# Patient Record
Sex: Male | Born: 1970 | Race: White | Hispanic: No | Marital: Married | Smoking: Never smoker
Health system: Southern US, Community
[De-identification: ages and names within clinical notes are randomized; demographics above are authoritative.]

## PROBLEM LIST (undated history)

## (undated) DIAGNOSIS — Z91018 Allergy to other foods: Secondary | ICD-10-CM

## (undated) DIAGNOSIS — E781 Pure hyperglyceridemia: Secondary | ICD-10-CM

## (undated) DIAGNOSIS — B019 Varicella without complication: Secondary | ICD-10-CM

## (undated) DIAGNOSIS — R51 Headache: Secondary | ICD-10-CM

## (undated) DIAGNOSIS — I10 Essential (primary) hypertension: Secondary | ICD-10-CM

## (undated) DIAGNOSIS — R519 Headache, unspecified: Secondary | ICD-10-CM

## (undated) DIAGNOSIS — K219 Gastro-esophageal reflux disease without esophagitis: Secondary | ICD-10-CM

## (undated) DIAGNOSIS — E785 Hyperlipidemia, unspecified: Secondary | ICD-10-CM

## (undated) DIAGNOSIS — I2510 Atherosclerotic heart disease of native coronary artery without angina pectoris: Secondary | ICD-10-CM

## (undated) DIAGNOSIS — I77810 Thoracic aortic ectasia: Secondary | ICD-10-CM

## (undated) DIAGNOSIS — G510 Bell's palsy: Secondary | ICD-10-CM

## (undated) DIAGNOSIS — G4733 Obstructive sleep apnea (adult) (pediatric): Secondary | ICD-10-CM

## (undated) HISTORY — DX: Bell's palsy: G51.0

## (undated) HISTORY — DX: Pure hyperglyceridemia: E78.1

## (undated) HISTORY — DX: Allergy to other foods: Z91.018

## (undated) HISTORY — DX: Gastro-esophageal reflux disease without esophagitis: K21.9

## (undated) HISTORY — DX: Thoracic aortic ectasia: I77.810

## (undated) HISTORY — DX: Varicella without complication: B01.9

## (undated) HISTORY — PX: KNEE ARTHROSCOPY: SUR90

## (undated) HISTORY — PX: EYE SURGERY: SHX253

## (undated) HISTORY — DX: Headache: R51

## (undated) HISTORY — DX: Atherosclerotic heart disease of native coronary artery without angina pectoris: I25.10

## (undated) HISTORY — DX: Headache, unspecified: R51.9

## (undated) HISTORY — DX: Essential (primary) hypertension: I10

## (undated) HISTORY — DX: Hyperlipidemia, unspecified: E78.5

## (undated) HISTORY — DX: Obstructive sleep apnea (adult) (pediatric): G47.33

---
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
## 2003-08-25 ENCOUNTER — Emergency Department (HOSPITAL_COMMUNITY): Admission: AD | Admit: 2003-08-25 | Discharge: 2003-08-25 | Payer: Self-pay | Admitting: Family Medicine

## 2003-08-30 ENCOUNTER — Encounter: Admission: RE | Admit: 2003-08-30 | Discharge: 2003-11-28 | Payer: Self-pay | Admitting: Family Medicine

## 2010-06-01 ENCOUNTER — Emergency Department (HOSPITAL_BASED_OUTPATIENT_CLINIC_OR_DEPARTMENT_OTHER)
Admission: EM | Admit: 2010-06-01 | Discharge: 2010-06-02 | Disposition: A | Discharge status: Other Acute Inpatient Hospital | Payer: Self-pay | Source: Home / Self Care | Admitting: Emergency Medicine

## 2010-06-02 ENCOUNTER — Observation Stay (HOSPITAL_COMMUNITY)
Admission: EM | Admit: 2010-06-02 | Discharge: 2010-06-02 | Payer: Self-pay | Attending: Internal Medicine | Admitting: Internal Medicine

## 2010-08-18 LAB — CBC
HCT: 41.9 % (ref 39.0–52.0)
HCT: 42.3 % (ref 39.0–52.0)
Hemoglobin: 13.9 g/dL (ref 13.0–17.0)
MCH: 27.5 pg (ref 26.0–34.0)
MCH: 28.1 pg (ref 26.0–34.0)
MCHC: 33.2 g/dL (ref 30.0–36.0)
MCHC: 34 g/dL (ref 30.0–36.0)
MCV: 80.9 fL (ref 78.0–100.0)
RBC: 4.95 MIL/uL (ref 4.22–5.81)
RDW: 12.9 % (ref 11.5–15.5)

## 2010-08-18 LAB — PROTIME-INR
INR: 0.93 (ref 0.00–1.49)
Prothrombin Time: 12.7 seconds (ref 11.6–15.2)

## 2010-08-18 LAB — COMPREHENSIVE METABOLIC PANEL
ALT: 31 U/L (ref 0–53)
AST: 33 U/L (ref 0–37)
CO2: 27 mEq/L (ref 19–32)
Calcium: 8.9 mg/dL (ref 8.4–10.5)
Chloride: 107 mEq/L (ref 96–112)
Creatinine, Ser: 0.99 mg/dL (ref 0.4–1.5)
GFR calc non Af Amer: >60 mL/min (ref >60)
Glucose, Bld: 103 mg/dL — ABNORMAL HIGH (ref 70–99)
Sodium: 140 mEq/L (ref 135–145)
Total Bilirubin: 0.6 mg/dL (ref 0.3–1.2)

## 2010-08-18 LAB — LIPID PANEL
Cholesterol: 268 mg/dL — ABNORMAL HIGH (ref 0–200)
LDL Cholesterol: 160 mg/dL — ABNORMAL HIGH (ref 0–99)
Triglycerides: 313 mg/dL — ABNORMAL HIGH (ref <150)
VLDL: 63 mg/dL — ABNORMAL HIGH (ref 0–40)

## 2010-08-18 LAB — DIFFERENTIAL
Basophils Absolute: 0 10*3/uL (ref 0.0–0.1)
Basophils Relative: 0 % (ref 0–1)
Eosinophils Relative: 5 % (ref 0–5)
Monocytes Absolute: 1 10*3/uL (ref 0.1–1.0)
Monocytes Relative: 14 % — ABNORMAL HIGH (ref 3–12)

## 2010-08-18 LAB — BASIC METABOLIC PANEL
BUN: 17 mg/dL (ref 6–23)
CO2: 24 mEq/L (ref 19–32)
Chloride: 107 mEq/L (ref 96–112)
Glucose, Bld: 100 mg/dL — ABNORMAL HIGH (ref 70–99)
Potassium: 4.1 mEq/L (ref 3.5–5.1)

## 2010-08-18 LAB — CARDIAC PANEL(CRET KIN+CKTOT+MB+TROPI)
Relative Index: 2.9 — ABNORMAL HIGH (ref 0.0–2.5)
Troponin I: 0.01 ng/mL (ref 0.00–0.06)

## 2010-09-09 NOTE — Discharge Summary (Signed)
  NAME:  William Barton, William Barton NO.:  [id number]  MEDICAL RECORD NO.:  [id number]          PATIENT TYPE:  INP  LOCATION:  3702                         FACILITY:  [facility]  PHYSICIAN:  Calvert Cantor, M.D.     DATE OF BIRTH:  Sep 25, 1970  DATE OF ADMISSION:  06/02/2010 DATE OF DISCHARGE:  06/02/2010                              DISCHARGE SUMMARY   PRIMARY CARE PHYSICIAN:  [facility].  PRESENTING COMPLAINT:  Left-sided facial weakness.  DISCHARGE DIAGNOSIS:  Left-sided facial weakness, most likely Bell palsy.  DISCHARGE MEDICATIONS:  The patient has been placed on a prednisone taper.  HOSPITAL COURSE:  This is a 40 year old male who presented to the hospital with a complaint of left-sided weakness which he woke up with on the morning prior to admission, he had difficulty closing his left eye.  The patient was admitted and had an MRI of the brain which did rule out an acute CVA.  It is suspected that this is Bell palsy and should resolve in the next few weeks.  Speech and swallow eval was requested and he was able to pass this.  He does not require any further therapy.  He is advised to follow up with his primary care physician in 1-2 weeks. Time on discharge was 35 minutes.     Calvert Cantor, M.D.     SR/MEDQ  D:  07/17/2010  T:  07/18/2010  Job:  [id number]  Electronically Signed by Calvert Cantor M.D. on 07/20/2010 03:03:18 PM

## 2013-10-13 DIAGNOSIS — J302 Other seasonal allergic rhinitis: Secondary | ICD-10-CM | POA: Insufficient documentation

## 2016-01-10 DIAGNOSIS — J343 Hypertrophy of nasal turbinates: Secondary | ICD-10-CM | POA: Insufficient documentation

## 2016-01-10 DIAGNOSIS — J342 Deviated nasal septum: Secondary | ICD-10-CM | POA: Insufficient documentation

## 2016-05-07 HISTORY — PX: NASAL SEPTUM SURGERY: SHX37

## 2016-07-24 ENCOUNTER — Telehealth: Payer: Self-pay | Admitting: Cardiology

## 2016-07-24 ENCOUNTER — Encounter: Payer: Self-pay | Admitting: Cardiology

## 2016-07-24 NOTE — Telephone Encounter (Signed)
Called pt and left message for pt to call back to update his Fm and Medical information.

## 2016-07-24 NOTE — Telephone Encounter (Signed)
Patient is returning your call,thanks. °

## 2016-07-24 NOTE — Telephone Encounter (Signed)
Matter has been addressed

## 2016-08-03 ENCOUNTER — Encounter: Payer: Self-pay | Admitting: Cardiology

## 2016-08-03 ENCOUNTER — Ambulatory Visit (INDEPENDENT_AMBULATORY_CARE_PROVIDER_SITE_OTHER): Payer: 59 | Admitting: Cardiology

## 2016-08-03 ENCOUNTER — Encounter (INDEPENDENT_AMBULATORY_CARE_PROVIDER_SITE_OTHER): Payer: Self-pay

## 2016-08-03 VITALS — BP 124/96 | HR 75 | Ht 69.0 in | Wt 210.0 lb

## 2016-08-03 DIAGNOSIS — E7800 Pure hypercholesterolemia, unspecified: Secondary | ICD-10-CM

## 2016-08-03 DIAGNOSIS — Z8249 Family history of ischemic heart disease and other diseases of the circulatory system: Secondary | ICD-10-CM | POA: Diagnosis not present

## 2016-08-03 DIAGNOSIS — E785 Hyperlipidemia, unspecified: Secondary | ICD-10-CM | POA: Insufficient documentation

## 2016-08-03 DIAGNOSIS — I10 Essential (primary) hypertension: Secondary | ICD-10-CM | POA: Insufficient documentation

## 2016-08-03 NOTE — Addendum Note (Signed)
Addended by: Chana BodeGREEN, Sie Formisano L on: 08/03/2016 01:57 PM   Modules accepted: Orders

## 2016-08-03 NOTE — Progress Notes (Signed)
Cardiology Office Note    Date:  08/03/2016   ID:  William Barton, DOB 01-Feb-1971, MRN [id number]  PCP:  Birdena JubileeZANARD, ROBYN, MD  Cardiologist:  Armanda Magicraci Asjah Rauda, MD   Chief Complaint  Patient presents with  . New Evaluation    family history for CAD/HTN and hyperlipidemia    History of Present Illness:  William Barton is a 46 y.o. male with a history of HTN and hyperlipidemia who requested to be seen by a Cardiologist due to family history of CAD.  He denies any chest pain or pressure, SOB, DOE, LE edema, dizziness, palpitations  or syncope.  He has no claudication symptoms.  His mom was diagnosed with MI and CAD at age 47.  He has never smoked. He does not get any aerobic exercise although has recently tried to start running.      Past Medical History:  Diagnosis Date  . Bell's palsy   . Chicken pox   . Generalized headaches   . GERD (gastroesophageal reflux disease)   . Hyperlipidemia   . Hypertension   . Hypertriglyceridemia     Past Surgical History:  Procedure Laterality Date  . APPENDECTOMY  1981  . EYE SURGERY     1975  . KNEE ARTHROSCOPY Left    2016  . NASAL SEPTUM SURGERY  05/07/2016  . TONSILLECTOMY  1975    Current Medications: No outpatient prescriptions have been marked as taking for the 08/03/16 encounter (Office Visit) with Quintella Reichertraci R Ashaad Gaertner, MD.    Allergies:   Ace inhibitors   Social History   Social History  . Marital status: Married    Spouse name: TRACEY  . Number of children: 2  . Years of education: 12   Occupational History  . [employer]    Social History Main Topics  . Smoking status: Never Smoker  . Smokeless tobacco: Never Used  . Alcohol use Yes  . Drug use: No  . Sexual activity: Yes    Partners: Female   Other Topics Concern  . None   Social History Narrative  . None     Family History:  The patient's family history includes Breast cancer in his maternal grandmother; Heart disease in his mother; Hypertension in his  maternal grandmother; Prostate cancer in his paternal grandfather.   ROS:   Please see the history of present illness.    ROS All other systems reviewed and are negative.  No flowsheet data found.     PHYSICAL EXAM:   VS:  BP (!) 124/96   Pulse 75   Ht 5\' 9"  (1.753 m)   Wt 210 lb (95.3 kg)   BMI 31.01 kg/m    GEN: Well nourished, well developed, in no acute distress  HEENT: normal  Neck: no JVD, carotid bruits, or masses Cardiac: RRR; no murmurs, rubs, or gallops,no edema.  Intact distal pulses bilaterally.  Respiratory:  clear to auscultation bilaterally, normal work of breathing GI: soft, nontender, nondistended, + BS MS: no deformity or atrophy  Skin: warm and dry, no rash Neuro:  Alert and Oriented x 3, Strength and sensation are intact Psych: euthymic mood, full affect  Wt Readings from Last 3 Encounters:  08/03/16 210 lb (95.3 kg)      Studies/Labs Reviewed:   EKG:  EKG is ordered today.  The ekg ordered today demonstrates NSR with no ST changes  Recent Labs: No results found for requested labs within last 8760 hours.   Lipid Panel  Component Value Date/Time   CHOL (H) 06/02/2010 0840    268        ATP III CLASSIFICATION:  <200     mg/dL   Desirable  161-096  mg/dL   Borderline High  >=045    mg/dL   High          TRIG 409 (H) 06/02/2010 0840   HDL 45 06/02/2010 0840   CHOLHDL 6.0 06/02/2010 0840   VLDL 63 (H) 06/02/2010 0840   LDLCALC (H) 06/02/2010 0840    160        Total Cholesterol/HDL:CHD Risk Coronary Heart Disease Risk Table                     Men   Women  1/2 Average Risk   3.4   3.3  Average Risk       5.0   4.4  2 X Average Risk   9.6   7.1  3 X Average Risk  23.4   11.0        Use the calculated Patient Ratio above and the CHD Risk Table to determine the patient's CHD Risk.        ATP III CLASSIFICATION (LDL):  <100     mg/dL   Optimal  811-914  mg/dL   Near or Above                    Optimal  130-159  mg/dL   Borderline   782-956  mg/dL   High  >213     mg/dL   Very High    Additional studies/ records that were reviewed today include:  Office notes from PCP    ASSESSMENT:    1. Family history of early CAD   2. Benign essential HTN   3. Pure hypercholesterolemia      PLAN:  In order of problems listed above:  1. Family history of CAD - he is asymptomatic.  His CRFs include HTN, hyperlipidemia and family history of CAD.  His EKG is nonischemic. I will get an ETT to rule out ischemia and Chest CT for calcium score for risk stratification.   2. HTN - mainly diastolic HTN.  His DBP is elevated at .  I have asked him to check his BP daily for a week and call with results.  If still elevated, I will increase his amlodipine. 3. Hyperlipidemia - He will continue on statin and fenofibrate.     Medication Adjustments/Labs and Tests Ordered: Current medicines are reviewed at length with the patient today.  Concerns regarding medicines are outlined above.  Medication changes, Labs and Tests ordered today are listed in the Patient Instructions below.  There are no Patient Instructions on file for this visit.   Signed, Armanda Magic, MD  08/03/2016 11:48 AM    [facility] [address] [place], [place], [place]  [address] Phone: [phone]; Fax: [phone]

## 2016-08-03 NOTE — Patient Instructions (Signed)
Medication Instructions:  Your physician recommends that you continue on your current medications as directed. Please refer to the Current Medication list given to you today.   Labwork: None    Testing/Procedures: Your physician has requested that you have an exercise tolerance test. For further information please visit [email or web address]. Please also follow instruction sheet, as given.   Dr. Mayford Knifeurner recommends you have a CORONARY CALCIUM SCORE.  Follow-Up: Your physician wants you to follow-up in: 1 year with Dr. Mayford Knifeurner.  You will receive a reminder letter in the mail two months in advance. If you don't receive a letter, please call our office to schedule the follow-up appointment.   Any Other Special Instructions Will Be Listed Below (If Applicable).     If you need a refill on your cardiac medications before your next appointment, please call your pharmacy.

## 2016-08-12 ENCOUNTER — Ambulatory Visit (INDEPENDENT_AMBULATORY_CARE_PROVIDER_SITE_OTHER): Payer: 59

## 2016-08-12 ENCOUNTER — Ambulatory Visit (INDEPENDENT_AMBULATORY_CARE_PROVIDER_SITE_OTHER)
Admission: RE | Admit: 2016-08-12 | Discharge: 2016-08-12 | Disposition: A | Discharge status: Home / Self Care | Payer: Self-pay | Source: Ambulatory Visit | Attending: Cardiology | Admitting: Cardiology

## 2016-08-12 DIAGNOSIS — I10 Essential (primary) hypertension: Secondary | ICD-10-CM | POA: Diagnosis not present

## 2016-08-12 DIAGNOSIS — E7800 Pure hypercholesterolemia, unspecified: Secondary | ICD-10-CM

## 2016-08-12 DIAGNOSIS — Z8249 Family history of ischemic heart disease and other diseases of the circulatory system: Secondary | ICD-10-CM | POA: Diagnosis not present

## 2016-08-12 LAB — EXERCISE TOLERANCE TEST
CHL CUP RESTING HR STRESS: 81 {beats}/min
CHL CUP STRESS STAGE 1 GRADE: 0 %
CHL CUP STRESS STAGE 1 HR: 82 {beats}/min
CHL CUP STRESS STAGE 1 SBP: 146 mmHg
CHL CUP STRESS STAGE 1 SPEED: 0 mph
CHL CUP STRESS STAGE 10 DBP: 96 mmHg
CHL CUP STRESS STAGE 10 GRADE: 0 %
CHL CUP STRESS STAGE 2 GRADE: 0 %
CHL CUP STRESS STAGE 2 HR: 86 {beats}/min
CHL CUP STRESS STAGE 2 SPEED: 0.9 mph
CHL CUP STRESS STAGE 4 GRADE: 10 %
CHL CUP STRESS STAGE 5 GRADE: 12 %
CHL CUP STRESS STAGE 5 HR: 110 {beats}/min
CHL CUP STRESS STAGE 5 SBP: 182 mmHg
CHL CUP STRESS STAGE 6 SBP: 201 mmHg
CHL CUP STRESS STAGE 7 SBP: 210 mmHg
CHL CUP STRESS STAGE 9 GRADE: 0 %
CSEPED: 13 min
CSEPEDS: 0 s
CSEPEW: 15.3 METS
CSEPHR: 95 %
MPHR: 174 {beats}/min
Peak HR: 164 {beats}/min
Percent of predicted max HR: 94 %
RPE: 15
Stage 1 DBP: 100 mmHg
Stage 10 HR: 100 {beats}/min
Stage 10 SBP: 155 mmHg
Stage 10 Speed: 0 mph
Stage 3 Grade: 0.1 %
Stage 3 HR: 86 {beats}/min
Stage 3 Speed: 1 mph
Stage 4 HR: 99 {beats}/min
Stage 4 Speed: 1.7 mph
Stage 5 DBP: 98 mmHg
Stage 5 Speed: 2.5 mph
Stage 6 DBP: 98 mmHg
Stage 6 Grade: 14 %
Stage 6 HR: 121 {beats}/min
Stage 6 Speed: 3.5 mph
Stage 7 DBP: 93 mmHg
Stage 7 Grade: 16 %
Stage 7 HR: 141 {beats}/min
Stage 7 Speed: 4.2 mph
Stage 8 Grade: 18 %
Stage 8 HR: 164 {beats}/min
Stage 8 Speed: 5 mph
Stage 9 DBP: 85 mmHg
Stage 9 HR: 142 {beats}/min
Stage 9 SBP: 175 mmHg
Stage 9 Speed: 1.5 mph

## 2016-08-19 ENCOUNTER — Telehealth: Payer: Self-pay | Admitting: Cardiology

## 2016-08-19 NOTE — Telephone Encounter (Signed)
Informed patient labs have not been received. Informed him his PCP will be called tomorrow to request. He understands he will be called once received and reviewed by Dr. Mayford Knifeurner. He was grateful for call.

## 2016-08-19 NOTE — Telephone Encounter (Signed)
New Message  Pt voiced calling to check on fax.  Please f/u

## 2016-08-20 NOTE — Telephone Encounter (Signed)
Labs received and given to Medical Records to be routed to Dr. Turner. 

## 2016-08-26 ENCOUNTER — Telehealth: Payer: Self-pay

## 2016-08-26 DIAGNOSIS — E785 Hyperlipidemia, unspecified: Secondary | ICD-10-CM

## 2016-08-26 NOTE — Telephone Encounter (Signed)
Dr. Mayford Knifeurner reviewed labs in Care Everywhere.  Per Dr. Mayford Knifeurner, LDL 76 and too high as his goal is to be below 70. Patient to START ZETIA 10 mg daily and scheduled FLP and ALT in 8 weeks.

## 2016-08-26 NOTE — Telephone Encounter (Signed)
Patient declines to start new medication at this time. He will watch his fat intake and exercise more and repeat FLP and ALT in 4 months. Repeat fasting labs scheduled 8/1. Patient agrees with treatment plan.

## 2016-11-23 ENCOUNTER — Telehealth: Payer: Self-pay | Admitting: Cardiology

## 2016-11-23 NOTE — Telephone Encounter (Signed)
New message    Albin FellingCarla from [facility] is calling to find out if EKG was received and reviewed for pt. She said they faxed it on Monday, but she will refax it today.

## 2016-11-24 NOTE — Telephone Encounter (Signed)
Instructed Carla to re-fax EKG to [phone]. She agrees with treatment plan.

## 2016-11-25 ENCOUNTER — Telehealth: Payer: Self-pay | Admitting: Cardiology

## 2016-11-25 NOTE — Telephone Encounter (Signed)
EKG received and given to Dr. Mayford Knifeurner.  Instructed Dr. Mayford Knifeurner to and call Dr. Alberteen SamZanard to discuss.

## 2016-11-25 NOTE — Telephone Encounter (Signed)
Called by Dr. Vicenta DunningZanad in regards to patient.  He lost a relative from an MI recently and now complaining of atypical CP which she thinks is due to GERD as he stopped taking his PPI.  She restarted this but wanted me to review his EKG.  The EKG showed NSR with subtle 0.535mm of J point elevation more than prior EKG in V3 only and otherwise unchanged from prior EKG.  I recommended that he followup with us in office tomorrow as he did have CT showing coronary calcium in his LAD and RCA.  She will call patient and let us know whether he is willing to followup tomorrow with us.

## 2017-01-06 ENCOUNTER — Other Ambulatory Visit: Payer: 59 | Admitting: *Deleted

## 2017-01-06 DIAGNOSIS — E785 Hyperlipidemia, unspecified: Secondary | ICD-10-CM

## 2017-01-06 LAB — LIPID PANEL
CHOLESTEROL TOTAL: 145 mg/dL (ref 100–199)
Chol/HDL Ratio: 3.6 ratio (ref 0.0–5.0)
HDL: 40 mg/dL (ref >39)
LDL CALC: 81 mg/dL (ref 0–99)
TRIGLYCERIDES: 121 mg/dL (ref 0–149)
VLDL CHOLESTEROL CAL: 24 mg/dL (ref 5–40)

## 2017-01-06 LAB — ALT: ALT: 29 IU/L (ref 0–44)

## 2017-06-22 DIAGNOSIS — K5730 Diverticulosis of large intestine without perforation or abscess without bleeding: Secondary | ICD-10-CM | POA: Insufficient documentation

## 2017-06-22 DIAGNOSIS — K219 Gastro-esophageal reflux disease without esophagitis: Secondary | ICD-10-CM | POA: Insufficient documentation

## 2017-06-26 ENCOUNTER — Emergency Department (HOSPITAL_BASED_OUTPATIENT_CLINIC_OR_DEPARTMENT_OTHER)
Admission: EM | Admit: 2017-06-26 | Discharge: 2017-06-26 | Disposition: A | Discharge status: Home / Self Care | Payer: 59 | Attending: Emergency Medicine | Admitting: Emergency Medicine

## 2017-06-26 ENCOUNTER — Other Ambulatory Visit: Payer: Self-pay

## 2017-06-26 ENCOUNTER — Emergency Department (HOSPITAL_BASED_OUTPATIENT_CLINIC_OR_DEPARTMENT_OTHER): Payer: 59

## 2017-06-26 ENCOUNTER — Encounter (HOSPITAL_BASED_OUTPATIENT_CLINIC_OR_DEPARTMENT_OTHER): Payer: Self-pay | Admitting: *Deleted

## 2017-06-26 DIAGNOSIS — R51 Headache: Secondary | ICD-10-CM | POA: Insufficient documentation

## 2017-06-26 DIAGNOSIS — Z79899 Other long term (current) drug therapy: Secondary | ICD-10-CM | POA: Insufficient documentation

## 2017-06-26 DIAGNOSIS — I10 Essential (primary) hypertension: Secondary | ICD-10-CM | POA: Insufficient documentation

## 2017-06-26 DIAGNOSIS — R519 Headache, unspecified: Secondary | ICD-10-CM

## 2017-06-26 LAB — BASIC METABOLIC PANEL
ANION GAP: 7 (ref 5–15)
BUN: 17 mg/dL (ref 6–20)
CALCIUM: 9.3 mg/dL (ref 8.9–10.3)
CO2: 24 mmol/L (ref 22–32)
CREATININE: 0.99 mg/dL (ref 0.61–1.24)
Chloride: 109 mmol/L (ref 101–111)
GLUCOSE: 100 mg/dL — AB (ref 65–99)
Potassium: 3.8 mmol/L (ref 3.5–5.1)
Sodium: 140 mmol/L (ref 135–145)

## 2017-06-26 LAB — CBC WITH DIFFERENTIAL/PLATELET
BASOS ABS: 0 10*3/uL (ref 0.0–0.1)
BASOS PCT: 1 %
EOS ABS: 0.2 10*3/uL (ref 0.0–0.7)
EOS PCT: 4 %
HEMATOCRIT: 40.6 % (ref 39.0–52.0)
Hemoglobin: 13.7 g/dL (ref 13.0–17.0)
Lymphocytes Relative: 32 %
Lymphs Abs: 1.3 10*3/uL (ref 0.7–4.0)
MCH: 29.3 pg (ref 26.0–34.0)
MCHC: 33.7 g/dL (ref 30.0–36.0)
MCV: 86.8 fL (ref 78.0–100.0)
MONO ABS: 0.5 10*3/uL (ref 0.1–1.0)
Monocytes Relative: 12 %
NEUTROS ABS: 2.2 10*3/uL (ref 1.7–7.7)
Neutrophils Relative %: 53 %
Platelets: 309 10*3/uL (ref 150–400)
RBC: 4.68 MIL/uL (ref 4.22–5.81)
RDW: 12.9 % (ref 11.5–15.5)
WBC: 4.1 10*3/uL (ref 4.0–10.5)

## 2017-06-26 MED ORDER — SODIUM CHLORIDE 0.9 % IV BOLUS (SEPSIS)
1000.0000 mL | Freq: Once | INTRAVENOUS | Status: AC
  Administered 2017-06-26: 1000 mL via INTRAVENOUS

## 2017-06-26 MED ORDER — DIPHENHYDRAMINE HCL 50 MG/ML IJ SOLN: 25.0000 mg | Freq: Once | INTRAMUSCULAR | Status: DC

## 2017-06-26 MED ORDER — SODIUM CHLORIDE 0.9 % IV SOLN: INTRAVENOUS | Status: DC

## 2017-06-26 MED ORDER — DEXAMETHASONE SODIUM PHOSPHATE 10 MG/ML IJ SOLN
10.0000 mg | Freq: Once | INTRAMUSCULAR | Status: AC
  Administered 2017-06-26: 10 mg via INTRAVENOUS
  Filled 2017-06-26: qty 1

## 2017-06-26 MED ORDER — METOCLOPRAMIDE HCL 5 MG/ML IJ SOLN
10.0000 mg | Freq: Once | INTRAMUSCULAR | Status: AC
  Administered 2017-06-26: 10 mg via INTRAVENOUS
  Filled 2017-06-26: qty 2

## 2017-06-26 MED ORDER — DEXAMETHASONE SODIUM PHOSPHATE 10 MG/ML IJ SOLN
INTRAMUSCULAR | Status: AC
  Administered 2017-06-26: 10 mg via INTRAVENOUS
  Filled 2017-06-26: qty 1

## 2017-06-26 NOTE — ED Triage Notes (Signed)
Pt c/o h/a x 4 days 

## 2017-06-26 NOTE — Discharge Instructions (Signed)
Go home and rest.  Head CT without any acute findings.  Hopefully the Decadron will abate the headache by morning.  If not very important to follow-up with your primary care doctor or if there is a recurrence.  If headache persist she will need MRI of the brain.  Return for any new or worse symptoms.

## 2017-06-26 NOTE — ED Provider Notes (Signed)
[facility] EMERGENCY DEPARTMENT Provider Note   CSN: [id number] Arrival date & time: 06/26/17  4098     History   Chief Complaint Chief Complaint  Patient presents with  . Headache    HPI William Barton is a 47 y.o. male.  Patient with complaint of headache severe at times on the top of the head and bilateral parietal area.  Ongoing for 4 days.  No visual changes.  No weakness no numbness.  No neck pain no back pain no fevers.  No history of migraines.  Patient at times has had mild headaches in the past but usually easily resolved with a BC powder.      Past Medical History:  Diagnosis Date  . Bell's palsy   . Chicken pox   . Generalized headaches   . GERD (gastroesophageal reflux disease)   . Hyperlipidemia   . Hypertension   . Hypertriglyceridemia     Patient Active Problem List   Diagnosis Date Noted  . Family history of early CAD 08/03/2016  . Benign essential HTN 08/03/2016  . Hyperlipidemia 08/03/2016    Past Surgical History:  Procedure Laterality Date  . APPENDECTOMY  1981  . EYE SURGERY     1975  . KNEE ARTHROSCOPY Left    2016  . NASAL SEPTUM SURGERY  05/07/2016  . TONSILLECTOMY  1975       Home Medications    Prior to Admission medications   Medication Sig Start Date End Date Taking? Authorizing Provider  amLODipine (NORVASC) 5 MG tablet Take 5 mg by mouth daily.    [provider]  atorvastatin (LIPITOR) 80 MG tablet Pt takes 40 mg twice a week    [provider]  fenofibrate micronized (LOFIBRA) 134 MG capsule Take 134 mg by mouth daily before breakfast.    [provider]  ranitidine (ZANTAC) 150 MG tablet Take 150 mg by mouth at bedtime.    [provider]    Family History Family History  Problem Relation Age of Onset  . Heart disease Mother   . Breast cancer Maternal Grandmother   . Hypertension Maternal Grandmother   . Prostate cancer Paternal Grandfather     Social  History Social History   Tobacco Use  . Smoking status: Never Smoker  . Smokeless tobacco: Never Used  Substance Use Topics  . Alcohol use: Yes  . Drug use: No     Allergies   Ace inhibitors   Review of Systems Review of Systems  Constitutional: Negative for fever.  HENT: Negative for congestion.   Respiratory: Negative for shortness of breath.   Cardiovascular: Negative for chest pain.  Gastrointestinal: Negative for abdominal pain.  Musculoskeletal: Negative for back pain, neck pain and neck stiffness.  Skin: Negative for rash.  Neurological: Positive for headaches. Negative for dizziness, syncope, speech difficulty, weakness and numbness.  Hematological: Does not bruise/bleed easily.  Psychiatric/Behavioral: Negative for confusion.     Physical Exam Updated Vital Signs BP (!) 156/81 (BP Location: Left Arm)   Pulse 64   Temp 97.8 F (36.6 C) (Oral)   Resp 18   Ht 1.753 m (5\' 9" )   Wt 97.5 kg (215 lb)   SpO2 100%   BMI 31.75 kg/m   Physical Exam  Constitutional: He is oriented to person, place, and time. He appears well-developed and well-nourished. No distress.  HENT:  Head: Normocephalic and atraumatic.  Mouth/Throat: Oropharynx is clear and moist.  Eyes: Conjunctivae and EOM are normal. Pupils  are equal, round, and reactive to light.  Neck: Normal range of motion. Neck supple.  Cardiovascular: Normal rate, regular rhythm and normal heart sounds.  Pulmonary/Chest: Effort normal and breath sounds normal. No respiratory distress.  Abdominal: Soft. Bowel sounds are normal. There is no tenderness.  Musculoskeletal: Normal range of motion. He exhibits no edema.  Neurological: He is alert and oriented to person, place, and time. No cranial nerve deficit or sensory deficit. He exhibits normal muscle tone. Coordination normal.  Skin: Skin is warm.  Nursing note and vitals reviewed.    ED Treatments / Results  Labs (all labs ordered are listed, but only  abnormal results are displayed) Labs Reviewed  BASIC METABOLIC PANEL - Abnormal; Notable for the following components:      Result Value   Glucose, Bld 100 (*)    All other components within normal limits  CBC WITH DIFFERENTIAL/PLATELET    EKG  EKG Interpretation None       Radiology Ct Head Wo Contrast  Result Date: 06/26/2017 CLINICAL DATA:  Frontal headaches, hypertension, dizziness, blurred vision EXAM: CT HEAD WITHOUT CONTRAST TECHNIQUE: Contiguous axial images were obtained from the base of the skull through the vertex without intravenous contrast. COMPARISON:  02/06/2015 FINDINGS: Brain: No evidence of acute infarction, hemorrhage, hydrocephalus, extra-axial collection or mass lesion/mass effect. Vascular: No hyperdense vessel or unexpected calcification. Skull: Normal. Negative for fracture or focal lesion. Sinuses/Orbits: No acute finding. Other: None. IMPRESSION: Normal head CT without contrast Electronically Signed   By: Judie PetitM.  Shick M.D.   On: 06/26/2017 11:32    Procedures Procedures (including critical care time)  Medications Ordered in ED Medications  0.9 %  sodium chloride infusion ( Intravenous Not Given 06/26/17 1048)  diphenhydrAMINE (BENADRYL) injection 25 mg (25 mg Intravenous Not Given 06/26/17 1045)  sodium chloride 0.9 % bolus 1,000 mL (0 mLs Intravenous Stopped 06/26/17 1154)  dexamethasone (DECADRON) injection 10 mg (10 mg Intravenous Given 06/26/17 1101)  metoCLOPramide (REGLAN) injection 10 mg (10 mg Intravenous Given 06/26/17 1102)     Initial Impression / Assessment and Plan / ED Course  I have reviewed the triage vital signs and the nursing notes.  Pertinent labs & imaging results that were available during my care of the patient were reviewed by me and considered in my medical decision making (see chart for details).    CT head negative.  Basic labs negative.  Patient treated with migraine cocktail with improvement here.  Patient's blood pressure was  elevated.  Patient does have primary care provider appointment later this week.  Will have that rechecked.  Patient does have a history of hypertension.  Patient nontoxic no acute distress.  MRI was available today did offer MRI but patient stated that feeling better with rather follow-up with his doctor.  No focal neuro deficits.  With no concerns for meningitis.  Patient understands that if headaches persist MRI would be important part of the workup.   Final Clinical Impressions(s) / ED Diagnoses   Final diagnoses:  Acute intractable headache, unspecified headache type    ED Discharge Orders    None       Vanetta MuldersZackowski, Jama Mcmiller, MD 06/26/17 1546

## 2017-10-27 ENCOUNTER — Ambulatory Visit: Payer: 59 | Admitting: Cardiology

## 2017-11-10 DIAGNOSIS — R509 Fever, unspecified: Secondary | ICD-10-CM | POA: Insufficient documentation

## 2017-11-10 DIAGNOSIS — R059 Cough, unspecified: Secondary | ICD-10-CM | POA: Insufficient documentation

## 2017-11-10 DIAGNOSIS — W57XXXA Bitten or stung by nonvenomous insect and other nonvenomous arthropods, initial encounter: Secondary | ICD-10-CM | POA: Insufficient documentation

## 2017-11-10 DIAGNOSIS — J029 Acute pharyngitis, unspecified: Secondary | ICD-10-CM | POA: Insufficient documentation

## 2017-12-24 ENCOUNTER — Ambulatory Visit (INDEPENDENT_AMBULATORY_CARE_PROVIDER_SITE_OTHER): Payer: 59 | Admitting: Cardiology

## 2017-12-24 ENCOUNTER — Encounter: Payer: Self-pay | Admitting: Cardiology

## 2017-12-24 VITALS — BP 138/94 | HR 49 | Ht 69.0 in | Wt 214.0 lb

## 2017-12-24 DIAGNOSIS — I2510 Atherosclerotic heart disease of native coronary artery without angina pectoris: Secondary | ICD-10-CM | POA: Diagnosis not present

## 2017-12-24 DIAGNOSIS — G4733 Obstructive sleep apnea (adult) (pediatric): Secondary | ICD-10-CM | POA: Insufficient documentation

## 2017-12-24 DIAGNOSIS — I77810 Thoracic aortic ectasia: Secondary | ICD-10-CM | POA: Diagnosis not present

## 2017-12-24 DIAGNOSIS — Z8249 Family history of ischemic heart disease and other diseases of the circulatory system: Secondary | ICD-10-CM

## 2017-12-24 DIAGNOSIS — E7800 Pure hypercholesterolemia, unspecified: Secondary | ICD-10-CM

## 2017-12-24 DIAGNOSIS — I10 Essential (primary) hypertension: Secondary | ICD-10-CM

## 2017-12-24 HISTORY — DX: Atherosclerotic heart disease of native coronary artery without angina pectoris: I25.10

## 2017-12-24 HISTORY — DX: Thoracic aortic ectasia: I77.810

## 2017-12-24 LAB — LIPID PANEL
CHOL/HDL RATIO: 2.9 ratio (ref 0.0–5.0)
Cholesterol, Total: 139 mg/dL (ref 100–199)
HDL: 48 mg/dL (ref >39)
LDL Calculated: 78 mg/dL (ref 0–99)
Triglycerides: 64 mg/dL (ref 0–149)
VLDL Cholesterol Cal: 13 mg/dL (ref 5–40)

## 2017-12-24 LAB — HEPATIC FUNCTION PANEL
ALT: 24 IU/L (ref 0–44)
AST: 24 IU/L (ref 0–40)
Albumin: 4.5 g/dL (ref 3.5–5.5)
Alkaline Phosphatase: 61 IU/L (ref 39–117)
BILIRUBIN TOTAL: 0.4 mg/dL (ref 0.0–1.2)
BILIRUBIN, DIRECT: 0.12 mg/dL (ref 0.00–0.40)
Total Protein: 6.8 g/dL (ref 6.0–8.5)

## 2017-12-24 MED ORDER — AMLODIPINE BESYLATE 10 MG PO TABS: 10.0000 mg | ORAL_TABLET | Freq: Every day | ORAL | 3 refills | Status: DC

## 2017-12-24 NOTE — Progress Notes (Signed)
Cardiology Office Note:    Date:  12/24/2017   ID:  William DurhamWilliam Hazzard, DOB 02/07/1971, MRN [id number]  PCP:  Gillian ScarceZanard, Robyn K, MD  Cardiologist:  No primary care provider on file.    Referring MD: Gillian ScarceZanard, Robyn K, MD   Chief Complaint  Patient presents with  . Follow-up    Coronary artery calcifications, hyperlipidemia, hypertension    History of Present Illness:    William DurhamWilliam Swader is a 47 y.o. male with a hx of hypertension, hyperlipidemia and family history of CAD.  He saw me a year ago for cardiac evaluation due to a strong family history of coronary disease with his mom having an MI at 1467.  Exercise stress test was normal with no ischemia.  Coronary calcium score was 14 which was 82 percentile for age and sex matched controls.  He was also noted to have a dilated a sending aorta at 3.9 mm.  He was diagnosed with sleep apnea by his PCP and did not tolerate CPAP and is now using a oral device and just got finished with a home sleep study to see how well it is working.  He is here today for followup and is doing well.  He denies any chest pain or pressure, SOB, DOE, PND, orthopnea, LE edema, dizziness, palpitations or syncope. He is compliant with his meds and is tolerating meds with no SE.    Past Medical History:  Diagnosis Date  . 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 Bell's palsy   . Chicken pox   . Coronary artery calcification seen on CAT scan 12/24/2017  . Generalized headaches   . GERD (gastroesophageal reflux disease)   . Hyperlipidemia   . Hypertension   . Hypertriglyceridemia   . OSA (obstructive sleep apnea)    Intolerant to CPAP therapy and now using an oral device    Past Surgical History:  Procedure Laterality Date  . APPENDECTOMY  1981  . EYE SURGERY     1975  . KNEE ARTHROSCOPY Left    2016  . NASAL SEPTUM SURGERY  05/07/2016  . TONSILLECTOMY  1975    Current Medications: Current Meds  Medication Sig  . atorvastatin (LIPITOR) 80 MG tablet Pt  takes 40 mg twice a week  . fenofibrate micronized (LOFIBRA) 134 MG capsule Take 134 mg by mouth daily before breakfast.  . Omega-3 Fatty Acids (FISH OIL) 1200 MG CAPS Take 1,200 mg by mouth daily.  . ranitidine (ZANTAC) 150 MG tablet Take 150 mg by mouth at bedtime.  . [DISCONTINUED] amLODipine (NORVASC) 5 MG tablet Take 5 mg by mouth daily.     Allergies:   Ace inhibitors   Social History   Socioeconomic History  . Marital status: Married    Spouse name: TRACEY  . Number of children: 2  . Years of education: 7712  . Highest education level: Not on file  Occupational History  . Occupation: [employer]  Social Needs  . Financial resource strain: Not on file  . Food insecurity:    Worry: Not on file    Inability: Not on file  . Transportation needs:    Medical: Not on file    Non-medical: Not on file  Tobacco Use  . Smoking status: Never Smoker  . Smokeless tobacco: Never Used  Substance and Sexual Activity  . Alcohol use: Yes  . Drug use: No  . Sexual activity: Yes    Partners: Female  Lifestyle  . Physical activity:  Days per week: Not on file    Minutes per session: Not on file  . Stress: Not on file  Relationships  . Social connections:    Talks on phone: Not on file    Gets together: Not on file    Attends religious service: Not on file    Active member of club or organization: Not on file    Attends meetings of clubs or organizations: Not on file    Relationship status: Not on file  Other Topics Concern  . Not on file  Social History Narrative  . Not on file     Family History: The patient's family history includes Breast cancer in his maternal grandmother; Heart disease in his mother; Hypertension in his maternal grandmother; Prostate cancer in his paternal grandfather. none ROS:   Please see the history of present illness.    ROS  All other systems reviewed and negative.   EKGs/Labs/Other Studies Reviewed:    The following studies were  reviewed today: EKG  EKG:  EKG is  ordered today.  The ekg ordered today demonstrates Sinus bradycardia at 48 bpm with no ST-T wave at normalities.  Recent Labs: 01/06/2017: ALT 29 06/26/2017: BUN 17; Creatinine, Ser 0.99; Hemoglobin 13.7; Platelets 309; Potassium 3.8; Sodium 140   Recent Lipid Panel    Component Value Date/Time   CHOL 145 01/06/2017 0732   TRIG 121 01/06/2017 0732   HDL 40 01/06/2017 0732   CHOLHDL 3.6 01/06/2017 0732   CHOLHDL 6.0 06/02/2010 0840   VLDL 63 (H) 06/02/2010 0840   LDLCALC 81 01/06/2017 0732    Physical Exam:    VS:  BP (!) 138/94 (BP Location: Right Arm, Patient Position: Sitting, Cuff Size: Normal)   Pulse (!) 49   Ht 5\' 9"  (1.753 m)   Wt 214 lb (97.1 kg)   BMI 31.60 kg/m     Wt Readings from Last 3 Encounters:  12/24/17 214 lb (97.1 kg)  06/26/17 215 lb (97.5 kg)  08/03/16 210 lb (95.3 kg)     GEN:  Well nourished, well developed in no acute distress HEENT: Normal NECK: No JVD; No carotid bruits LYMPHATICS: No lymphadenopathy CARDIAC: RRR, no murmurs, rubs, gallops RESPIRATORY:  Clear to auscultation without rales, wheezing or rhonchi  ABDOMEN: Soft, non-tender, non-distended MUSCULOSKELETAL:  No edema; No deformity  SKIN: Warm and dry NEUROLOGIC:  Alert and oriented x 3 PSYCHIATRIC:  Normal affect   ASSESSMENT:    1. Coronary artery calcification seen on CAT scan   2. Benign essential HTN   3. Pure hypercholesterolemia   4. Ascending aorta dilatation (HCC)    PLAN:    In order of problems listed above:  1.  Coronary artery calcifications -calcium score was 14 which places patient in the 82nd percentile for age and sex matched controls.  Exercise treadmill test a year ago showed no inducible ischemia.  He will continue on statin therapy.  He is completely asymptomatic with no complaints of chest pain.  I will repeat an exercise treadmill test this year for routine screening.  2.  Hypertension -BP is borderline controlled on  exam today.  He will continue on amlodipine but I instructed him to increase the dose to 10 mg daily.  I will have him follow-up in hypertension clinic in 2 weeks.  3.  Hyperlipidemia -LDL goal is less than 70.  His LDL was 81 on 01/06/2017.  I will repeat an FLP and ALT.  He will continue on atorvastatin 80  mg daily and fenofibrate 134 mg daily.  4.  Dilated ascending aorta -noted on CT scan in 2018.  I will check a 2D echocardiogram to make sure that this is stable.  Medication Adjustments/Labs and Tests Ordered: Current medicines are reviewed at length with the patient today.  Concerns regarding medicines are outlined above.  Orders Placed This Encounter  Procedures  . EKG 12-Lead   No orders of the defined types were placed in this encounter.   Signed, Armanda Magic, MD  12/24/2017 8:53 AM    [facility]

## 2017-12-24 NOTE — Patient Instructions (Signed)
Medication Instructions:  Your physician has recommended you make the following change in your medication:   INCREASE: amlodipine to 10 mg daily  Labwork: TODAY: LIPIDS, LFTS  Testing/Procedures: Your physician has requested that you have an echocardiogram. Echocardiography is a painless test that uses sound waves to create images of your heart. It provides your doctor with information about the size and shape of your heart and how well your heart's chambers and valves are working. This procedure takes approximately one hour. There are no restrictions for this procedure.  Your physician has requested that you have an exercise tolerance test. For further information please visit [email or web address]. Please also follow instruction sheet, as given.  Follow-Up: Your physician recommends that you schedule a follow-up appointment in: 2 weeks in the [facility]pert[facility] for [facility] Pressure [facility].  Your physician wants you to follow-up in: 1 year with Dr. Mayford Knifeurner. You will receive a reminder letter in the mail two months in advance. If you don't receive a letter, please call our office to schedule the follow-up appointment.   Any Other Special Instructions Will Be Listed Below (If Applicable).     If you need a refill on your cardiac medications before your next appointment, please call your pharmacy.

## 2017-12-27 ENCOUNTER — Telehealth: Payer: Self-pay | Admitting: *Deleted

## 2017-12-27 DIAGNOSIS — Z8249 Family history of ischemic heart disease and other diseases of the circulatory system: Secondary | ICD-10-CM

## 2017-12-27 DIAGNOSIS — I2510 Atherosclerotic heart disease of native coronary artery without angina pectoris: Secondary | ICD-10-CM

## 2017-12-27 DIAGNOSIS — E7800 Pure hypercholesterolemia, unspecified: Secondary | ICD-10-CM

## 2017-12-27 MED ORDER — EZETIMIBE 10 MG PO TABS: 10.0000 mg | ORAL_TABLET | Freq: Every day | ORAL | 1 refills | Status: DC

## 2017-12-27 NOTE — Telephone Encounter (Signed)
Notes recorded by Turner, Traci R, MD on 7Quintella Reichert/21/2019 at 12:30 PM EDT LDL still not at goal - add Zetia 10mg  daily and repeat FLP and ALT in 6 weeks  Notified the pt that per Dr Mayford Knifeurner, his labs showed that his LDL is still not at goal, and she recommends that we add Zetia 10 mg po daily to his regimen, and repeat fasting lipids and ALT in 6 weeks.  Confirmed the pharmacy of choice with the pt.  Scheduled the pts labs for 6 weeks out of 02/08/18, to recheck lipids and ALT.  Pt is aware to come fasting to this lab appt.  Pt verbalized understanding and agrees with this plan.

## 2017-12-28 ENCOUNTER — Ambulatory Visit (HOSPITAL_COMMUNITY): Payer: 59 | Attending: Cardiology

## 2017-12-28 ENCOUNTER — Other Ambulatory Visit: Payer: Self-pay

## 2017-12-28 ENCOUNTER — Ambulatory Visit (INDEPENDENT_AMBULATORY_CARE_PROVIDER_SITE_OTHER): Payer: 59

## 2017-12-28 DIAGNOSIS — I2510 Atherosclerotic heart disease of native coronary artery without angina pectoris: Secondary | ICD-10-CM

## 2017-12-28 DIAGNOSIS — I77810 Thoracic aortic ectasia: Secondary | ICD-10-CM

## 2017-12-28 DIAGNOSIS — E785 Hyperlipidemia, unspecified: Secondary | ICD-10-CM | POA: Diagnosis not present

## 2017-12-28 DIAGNOSIS — Z8249 Family history of ischemic heart disease and other diseases of the circulatory system: Secondary | ICD-10-CM | POA: Insufficient documentation

## 2017-12-28 DIAGNOSIS — G4733 Obstructive sleep apnea (adult) (pediatric): Secondary | ICD-10-CM | POA: Diagnosis not present

## 2017-12-28 DIAGNOSIS — I119 Hypertensive heart disease without heart failure: Secondary | ICD-10-CM | POA: Diagnosis not present

## 2017-12-28 LAB — EXERCISE TOLERANCE TEST
CHL RATE OF PERCEIVED EXERTION: 16
CSEPED: 13 min
CSEPEW: 15.2 METS
Exercise duration (sec): 0 s
MPHR: 172 {beats}/min
Peak HR: 171 {beats}/min
Percent HR: 94 %
Rest HR: 64 {beats}/min

## 2018-01-11 ENCOUNTER — Ambulatory Visit (INDEPENDENT_AMBULATORY_CARE_PROVIDER_SITE_OTHER): Payer: 59 | Admitting: Pharmacist

## 2018-01-11 VITALS — BP 126/78 | HR 71

## 2018-01-11 DIAGNOSIS — I10 Essential (primary) hypertension: Secondary | ICD-10-CM | POA: Diagnosis not present

## 2018-01-11 NOTE — Patient Instructions (Addendum)
It was great seeing you today!  Continue amlodipine 10 mg once daily. Your blood pressure goal is <130/80. Consider checking your blood pressure occasionally. If you have low blood pressure you may feel some dizziness. Follow-up with Dr. Mayford Knifeurner as scheduled and hypertension clinic as needed.

## 2018-01-11 NOTE — Progress Notes (Signed)
Patient ID: William DurhamWilliam Senegal                 DOB: 1970/08/05                      MRN: [id number]     HPI: William Barton is a 47 y.o. male patient of Dr. Mayford Knifeurner who presents today for hypertension evaluation. PMH significant for hypertension, hyperlipidemia and family history of CAD. At his most recent OV his amlodipine was increased to 10mg  daily.   He presents today for management of blood pressure. Feels pretty good, endorses some muscle chest twitching and feels blood flowing/pumping after increasing amlodipine and ezetimibe. He is unsure if this is medication related or something new going on. Has not gotten worse or better since it started. Not uncomfortable but just present. Denies chest pain, SOB, dizziness. Denies any swelling in the legs, does endorse some joint pain.  Current HTN meds:  Amlodipine 10mg  daily  Previously tried: ACEi (cough)  BP goal: <130/80  Family History: Breast cancer in his maternal grandmother; Heart disease in his mother; Hypertension in his maternal grandmother; Prostate cancer in his paternal grandfather. none  Social History: denies tobacco, occasional alcohol  Diet: Combination of meals from out and home. When he eats out, gets [place] (grilled chicken), was on modified keto diet, doesn't really eat sugar. At home tries to not eat bread, does not add additional salt to anything. Mostly drinks water, usually has 2 cups of coffee in the morning, occasionally drinks Gatorade due to working outside in the sun. Usually incorporates vegetables in meals.  Exercise: Does [place] biking at home. Did good on it until June, plans to pick it up again.  Home BP readings: monitors BP at home when he gets headaches, has arm cuff but has not checked recently  Wt Readings from Last 3 Encounters:  12/24/17 214 lb (97.1 kg)  06/26/17 215 lb (97.5 kg)  08/03/16 210 lb (95.3 kg)   BP Readings from Last 3 Encounters:  01/11/18 126/78  12/24/17 (!) 138/94    06/26/17 (!) 156/81   Pulse Readings from Last 3 Encounters:  01/11/18 71  12/24/17 (!) 49  06/26/17 64    Renal function: CrCl cannot be calculated (Patient's most recent lab result is older than the maximum 21 days allowed.).  Past Medical History:  Diagnosis Date  . 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 Bell's palsy   . Chicken pox   . Coronary artery calcification seen on CAT scan 12/24/2017  . Generalized headaches   . GERD (gastroesophageal reflux disease)   . Hyperlipidemia   . Hypertension   . Hypertriglyceridemia   . OSA (obstructive sleep apnea)    Intolerant to CPAP therapy and now using an oral device    Current Outpatient Medications on File Prior to Visit  Medication Sig Dispense Refill  . amLODipine (NORVASC) 10 MG tablet Take 1 tablet (10 mg total) by mouth daily. 90 tablet 3  . atorvastatin (LIPITOR) 80 MG tablet Pt takes 40 mg twice a week    . ezetimibe (ZETIA) 10 MG tablet Take 1 tablet (10 mg total) by mouth daily. 90 tablet 1  . fenofibrate micronized (LOFIBRA) 134 MG capsule Take 134 mg by mouth daily before breakfast.    . Omega-3 Fatty Acids (FISH OIL) 1200 MG CAPS Take 1,200 mg by mouth daily.    . ranitidine (ZANTAC) 150 MG tablet Take 150 mg by mouth at bedtime.  No current facility-administered medications on file prior to visit.     Allergies  Allergen Reactions  . Ace Inhibitors Cough    Other reaction(s): Cough (ALLERGY/intolerance) Other reaction(s): Cough (ALLERGY/intolerance)     Blood pressure 126/78, pulse 71, SpO2 98 %.   Assessment/Plan: Hypertension: BP currently controlled. Continue current regimen with amlodipine.Encouraged patient to increase exercise, improve diet, and monitor BP at home. Educated on checking for swelling in legs and signs/symptoms of hypotension. Follow-up with Dr. Mayford Knife as scheduled and HTN clinic as needed.   Thank you, Freddie Apley. Cleatis Polka, PharmD  [facility]   01/11/2018 9:41 AM

## 2018-02-08 ENCOUNTER — Encounter (INDEPENDENT_AMBULATORY_CARE_PROVIDER_SITE_OTHER): Payer: Self-pay

## 2018-02-08 ENCOUNTER — Telehealth: Payer: Self-pay

## 2018-02-08 ENCOUNTER — Other Ambulatory Visit: Payer: 59 | Admitting: *Deleted

## 2018-02-08 DIAGNOSIS — Z79899 Other long term (current) drug therapy: Secondary | ICD-10-CM

## 2018-02-08 DIAGNOSIS — Z8249 Family history of ischemic heart disease and other diseases of the circulatory system: Secondary | ICD-10-CM

## 2018-02-08 DIAGNOSIS — E785 Hyperlipidemia, unspecified: Secondary | ICD-10-CM

## 2018-02-08 DIAGNOSIS — E7800 Pure hypercholesterolemia, unspecified: Secondary | ICD-10-CM

## 2018-02-08 DIAGNOSIS — I2510 Atherosclerotic heart disease of native coronary artery without angina pectoris: Secondary | ICD-10-CM

## 2018-02-08 LAB — LIPID PANEL
CHOL/HDL RATIO: 2.9 ratio (ref 0.0–5.0)
Cholesterol, Total: 123 mg/dL (ref 100–199)
HDL: 43 mg/dL (ref >39)
LDL Calculated: 64 mg/dL (ref 0–99)
TRIGLYCERIDES: 82 mg/dL (ref 0–149)
VLDL Cholesterol Cal: 16 mg/dL (ref 5–40)

## 2018-02-08 LAB — ALT: ALT: 26 IU/L (ref 0–44)

## 2018-02-08 NOTE — Telephone Encounter (Signed)
Notes recorded by Sigurd Sos, RN on 02/08/2018 at 5:16 PM EDT [id number] 9/3

## 2018-02-08 NOTE — Telephone Encounter (Signed)
-----   Message from Quintella Reichert, MD sent at 02/08/2018  5:12 PM EDT ----- Lipids at goal continue current therapy and forward to PCP

## 2018-02-09 NOTE — Telephone Encounter (Signed)
Repeat FLP in 4 weeks to make sure we have an accurate measure off Zetia

## 2018-02-09 NOTE — Telephone Encounter (Signed)
Spoke to patient to inform him of lab results (Lipids).  I told him to continue medication regimen, but he informed me that he stopped Ezetimibe 3-4 weeks ago (started on 7/22).  After he started the medication, he noticed cramping in his chest, heartburn and lips swelling.  He had not had a chance to call us and report these. Please advise, thank you.

## 2018-02-09 NOTE — Telephone Encounter (Signed)
Spoke to patient and set him up for labs 10/3 FLP.

## 2018-03-10 ENCOUNTER — Other Ambulatory Visit: Payer: 59

## 2018-03-16 DIAGNOSIS — M25569 Pain in unspecified knee: Secondary | ICD-10-CM | POA: Insufficient documentation

## 2018-03-29 DIAGNOSIS — M62838 Other muscle spasm: Secondary | ICD-10-CM | POA: Insufficient documentation

## 2018-03-29 DIAGNOSIS — T7840XA Allergy, unspecified, initial encounter: Secondary | ICD-10-CM | POA: Insufficient documentation

## 2018-03-29 DIAGNOSIS — Z0000 Encounter for general adult medical examination without abnormal findings: Secondary | ICD-10-CM | POA: Insufficient documentation

## 2018-05-24 ENCOUNTER — Ambulatory Visit (INDEPENDENT_AMBULATORY_CARE_PROVIDER_SITE_OTHER): Payer: 59 | Admitting: Allergy

## 2018-05-24 ENCOUNTER — Encounter: Payer: Self-pay | Admitting: Allergy

## 2018-05-24 VITALS — BP 120/90 | HR 78 | Temp 98.1°F | Resp 18 | Ht 69.0 in | Wt 218.0 lb

## 2018-05-24 DIAGNOSIS — T783XXD Angioneurotic edema, subsequent encounter: Secondary | ICD-10-CM

## 2018-05-24 DIAGNOSIS — L509 Urticaria, unspecified: Secondary | ICD-10-CM

## 2018-05-24 DIAGNOSIS — T783XXA Angioneurotic edema, initial encounter: Secondary | ICD-10-CM | POA: Insufficient documentation

## 2018-05-24 NOTE — Progress Notes (Signed)
New Patient Note  RE: William Barton MRN: [id number] DOB: Jun 04, 1971 Date of Office Visit: 05/24/2018  Referring provider: Gillian Scarce, MD Primary care provider: Gillian Scarce, MD  Chief Complaint: Urticaria; Pruritis; and Angioedema (lip)  History of Present Illness: I had the pleasure of seeing William Barton for initial evaluation at the [facility] on 05/24/2018. He is a 47 y.o. male, who is referred here by Gillian Scarce, MD for the evaluation of pruritus, hives, angioedema.   Rash started about 5-6 months ago. Mainly occurs on his hands, wrists, feet, lip. Describes them as pruritic, erythematous, raised. Individual rashes lasts about few hours. No ecchymosis upon resolution. Associated symptoms include: lip angioedema but no respiratory compromise. Suspected triggers are unknown. Denies any fevers, chills, changes in medications, foods, personal care products or recent infections. He has tried the following therapies: zyrtec 10mg  daily with good benefit. Systemic steroids none. Currently on zyrtec 10mg  daily but since he has been off the last few days has noticed some increasing symptoms. Eats red meat about 3 times a week. No recent tick bites. Previous work up includes: none. Previous history of rash/hives: heat rash on torso as a child.  Assessment and Plan: William Barton is a 47 y.o. male with: Urticaria Breaking out in rash/hives/lip angioedema for the past 5-6 months. No triggers noted. Denies any changes in diet, medications, personal care products or recent infections. No recent bloodwork. Zyrtec 10mg  daily seems to resolve the symptoms.  Today's skin testing showed: Negative to environmental allergens and common foods.  Get bloodwork as below.  . Based on clinical history, he likely has chronic idiopathic urticaria. Discussed with patient, that urticaria is usually caused by release of histamine by cutaneous mast cells but sometimes it is non-histamine  mediated. Explained that urticaria is not always associated with allergies, and may be related to other infectious or autoimmune causes. In most cases, the exact etiology for urticaria can not be established and it is considered idiopathic. Marland Kitchen Meanwhile start the following medications: zyrtec 10mg  once a day and may increase to 20mg  twice a day if needed. . Avoid the following potential triggers: alcohol, tight clothing, NSAIDs.    Angioedema of lips See assessment and plan as above.   For mild symptoms you can take over the counter antihistamines such as Benadryl and monitor symptoms closely. If symptoms worsen or if you have severe symptoms including breathing issues, throat closure, significant swelling, whole body hives, severe diarrhea and vomiting, lightheadedness then seek immediate medical care.  Return in about 2 months (around 07/25/2018).  No orders of the defined types were placed in this encounter.   Lab Orders     CBC With Differential     Comprehensive metabolic panel     Sedimentation rate     C-reactive protein     Thyroid Cascade Profile     ANA w/Reflex     C3 and C4     C1 esterase inhibitor, functional     C1 Esterase Inhibitor     Alpha-Gal Panel  Other allergy screening: Asthma: no Rhino conjunctivitis: no Food allergy: no  Eats red meat 3 times a week. No recent tick bites. Medication allergy: yes  Ace inhibitors - cough Hymenoptera allergy: no Urticaria: yes Eczema:no History of recurrent infections suggestive of immunodeficency: no  Diagnostics: Skin Testing: Environmental allergy panel and select foods. Negative test to: environmental allergies and select foods.  Results discussed with patient/family. Airborne Adult Perc -  05/24/18 1008    Time Antigen Placed  1008    Allergen Manufacturer  Waynette ButteryGreer    Location  Back    Number of Test  59    Panel 1  Select    1. Control-Buffer 50% Glycerol  Negative    2. Control-Histamine 1 mg/ml  2+    3.  Albumin saline  Negative    4. Bahia  Negative    5. French Southern Territ[place]ermuda  Negative    6. Johnson  Negative    7. Kentucky Blue  Negative    8. Meadow Fescue  Negative    9. Perennial Rye  Negative    10. Sweet Vernal  Negative    11. Timothy  Negative    12. Cocklebur  Negative    13. Burweed Marshelder  Negative    14. Ragweed, short  Negative    15. Ragweed, Giant  Negative    16. Plantain,  English  Negative    17. Lamb's Quarters  Negative    18. Sheep Sorrell  Negative    19. Rough Pigweed  Negative    20. Marsh Elder, Rough  Negative    21. Mugwort, Common  Negative    22. Ash mix  Negative    23. Birch mix  Negative    24. Beech American  Negative    25. Box, Elder  Negative    26. Cedar, red  Negative    27. Cottonwood, Guinea-Bissau[place]astern  Negative    28. Elm mix  Negative    29. Hickory mix  Negative    30. Maple mix  Negative    31. Oak, Guinea-Bissau[place]astern mix  Negative    32. Pecan Pollen  Negative    33. Pine mix  Negative    34. Sycamore Eastern  Negative    35. Walnut, Black Pollen  Negative    36. Alternaria alternata  Negative    37. Cladosporium Herbarum  Negative    38. Aspergillus mix  Negative    39. Penicillium mix  Negative    40. Bipolaris sorokiniana (Helminthosporium)  Negative    41. Drechslera spicifera (Curvularia)  Negative    42. Mucor plumbeus  Negative    43. Fusarium moniliforme  Negative    44. Aureobasidium pullulans (pullulara)  Negative    45. Rhizopus oryzae  Negative    46. Botrytis cinera  Negative    47. Epicoccum nigrum  Negative    48. Phoma betae  Negative    49. Candida Albicans  Negative    50. Trichophyton mentagrophytes  Negative    51. Mite, D Farinae  5,000 AU/ml  Negative    52. Mite, D Pteronyssinus  5,000 AU/ml  Negative    53. Cat Hair 10,000 BAU/ml  Negative    54.  Dog Epithelia  Negative    55. Mixed Feathers  Negative    56. Horse Epithelia  Negative    57. Cockroach, German  Negative    58. Mouse  Negative    59. Tobacco Leaf  Negative      Food Perc - 05/24/18 1008    Time Antigen Placed  1008    Allergen Manufacturer  Waynette ButteryGreer    Location  Back    Number of allergen test  10    Food  Select    1. Peanut  Negative    2. Soybean food  Negative    3. Wheat, whole  Negative    4. Sesame  Negative    5. Milk, cow  Negative    6. Egg White, chicken  Negative    7. Casein  Negative    8. Shellfish mix  Negative    9. Fish mix  Negative    10. Cashew  Negative       Past Medical History: Patient Active Problem List   Diagnosis Date Noted  . Urticaria 05/24/2018  . 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 Coronary artery calcification seen on CAT scan 12/24/2017  . 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 OSA (obstructive sleep apnea)   . Family history of early CAD 08/03/2016  . Benign essential HTN 08/03/2016  . Hyperlipidemia 08/03/2016   Past Medical History:  Diagnosis Date  . 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 Bell's palsy   . Chicken pox   . Coronary artery calcification seen on CAT scan 12/24/2017  . Generalized headaches   . GERD (gastroesophageal reflux disease)   . Hyperlipidemia   . Hypertension   . Hypertriglyceridemia   . OSA (obstructive sleep apnea)    Intolerant to CPAP therapy and now using an oral device   Past Surgical History: Past Surgical History:  Procedure Laterality Date  . APPENDECTOMY  1981  . EYE SURGERY     1975  . KNEE ARTHROSCOPY Left    2016  . NASAL SEPTUM SURGERY  05/07/2016  . TONSILLECTOMY  1975   Medication List:  Current Outpatient Medications  Medication Sig Dispense Refill  . amLODipine (NORVASC) 10 MG tablet amlodipine 10 mg tablet    . atorvastatin (LIPITOR) 80 MG tablet Pt takes 40 mg twice a week    . fenofibrate micronized (LOFIBRA) 134 MG capsule Take 134 mg by mouth daily before breakfast.    . Omega-3 Fatty Acids (FISH OIL) 1200 MG CAPS Take 1,200 mg by mouth daily.     No current facility-administered medications for this visit.     Allergies: Allergies  Allergen Reactions  . Ace Inhibitors Cough    Other reaction(s): Cough (ALLERGY/intolerance) Other reaction(s): Cough (ALLERGY/intolerance)    Social History: Social History   Socioeconomic History  . Marital status: Married    Spouse name: TRACEY  . Number of children: 2  . Years of education: 76  . Highest education level: Not on file  Occupational History  . Occupation: [employer]  Social Needs  . Financial resource strain: Not on file  . Food insecurity:    Worry: Not on file    Inability: Not on file  . Transportation needs:    Medical: Not on file    Non-medical: Not on file  Tobacco Use  . Smoking status: Never Smoker  . Smokeless tobacco: Never Used  Substance and Sexual Activity  . Alcohol use: Yes  . Drug use: No  . Sexual activity: Yes    Partners: Female  Lifestyle  . Physical activity:    Days per week: Not on file    Minutes per session: Not on file  . Stress: Not on file  Relationships  . Social connections:    Talks on phone: Not on file    Gets together: Not on file    Attends religious service: Not on file    Active member of club or organization: Not on file    Attends meetings of clubs or organizations: Not on file    Relationship status: Not on file  Other Topics Concern  . Not on file  Social History Narrative  .  Not on file   Lives in a home built in 2003. Smoking: denies Occupation: Market researcher for [employer] History: Water Damage/mildew in the house: no Engineer, civil (consulting) in the family room: no Carpet in the bedroom: yes Heating: electric Cooling: central Pet: yes 3 dogs x 14 yrs, 4 yrs, 2 months  Family History: Family History  Problem Relation Age of Onset  . Heart disease Mother   . Breast cancer Maternal Grandmother   . Hypertension Maternal Grandmother   . Prostate cancer Paternal Grandfather    Problem                               Relation Asthma                                    No  Eczema                                No  Food allergy                          No  Allergic rhino conjunctivitis     No   Review of Systems  Constitutional: Negative for appetite change, chills, fever and unexpected weight change.  HENT: Negative for congestion and rhinorrhea.   Eyes: Negative for itching.  Respiratory: Negative for cough, chest tightness, shortness of breath and wheezing.   Cardiovascular: Negative for chest pain.  Gastrointestinal: Negative for abdominal pain.  Genitourinary: Negative for difficulty urinating.  Skin: Positive for rash.  Allergic/Immunologic: Negative for environmental allergies and food allergies.  Neurological: Negative for headaches.   Objective: BP 120/90 (BP Location: Left Arm, Patient Position: Sitting, Cuff Size: Normal)   Pulse 78   Temp 98.1 F (36.7 C) (Oral)   Resp 18   Ht 5\' 9"  (1.753 m)   Wt 218 lb (98.9 kg)   SpO2 97%   BMI 32.19 kg/m  Body mass index is 32.19 kg/m. Physical Exam  Constitutional: He is oriented to person, place, and time. He appears well-developed and well-nourished.  HENT:  Head: Normocephalic and atraumatic.  Right Ear: External ear normal.  Left Ear: External ear normal.  Nose: Nose normal.  Mouth/Throat: Oropharynx is clear and moist.  Eyes: Conjunctivae and EOM are normal.  Neck: Neck supple.  Cardiovascular: Normal rate, regular rhythm and normal heart sounds. Exam reveals no gallop and no friction rub.  No murmur heard. Pulmonary/Chest: Effort normal and breath sounds normal. He has no wheezes. He has no rales.  Abdominal: Soft.  Lymphadenopathy:    He has no cervical adenopathy.  Neurological: He is alert and oriented to person, place, and time.  Skin: Skin is warm. Rash noted.  Small areas of flat erythematous patches on wrists b/l.  Psychiatric: He has a normal mood and affect. His behavior is normal.  Nursing note and vitals reviewed.  The plan was reviewed with the  patient/family, and all questions/concerned were addressed.  It was my pleasure to see Jordi today and participate in his care. Please feel free to contact me with any questions or concerns.  Sincerely,  Wyline Mood, DO Allergy & Immunology  [facility] office: [phone] [facility] office: [phone]

## 2018-05-24 NOTE — Assessment & Plan Note (Signed)
See assessment and plan as above.   For mild symptoms you can take over the counter antihistamines such as Benadryl and monitor symptoms closely. If symptoms worsen or if you have severe symptoms including breathing issues, throat closure, significant swelling, whole body hives, severe diarrhea and vomiting, lightheadedness then seek immediate medical care.

## 2018-05-24 NOTE — Patient Instructions (Addendum)
Today's skin testing showed: Negative to environmental allergens and common foods.  Get bloodwork. CBC diff, CMP, ESR, CRP, Thyroid cascade, ANA w/reflex, C3, C4 C1 esterase inhibitor & function, alpha gal.  . Based on clinical history, he likely has chronic idiopathic urticaria. Discussed with patient, that urticaria is usually caused by release of histamine by cutaneous mast cells but sometimes it is non-histamine mediated. Explained that urticaria is not always associated with allergies, and may be related to other infectious or autoimmune causes. In most cases, the exact etiology for urticaria can not be established and it is considered idiopathic. William Barton Meanwhile start the following medications: zyrtec 50m once a day and may increase to 261mtwice a day if needed. . Avoid the following potential triggers: alcohol, tight clothing, NSAIDs.   Follow up in 2 months

## 2018-05-24 NOTE — Assessment & Plan Note (Signed)
Breaking out in rash/hives/lip angioedema for the past 5-6 months. No triggers noted. Denies any changes in diet, medications, personal care products or recent infections. No recent bloodwork. Zyrtec 10mg  daily seems to resolve the symptoms.  Today's skin testing showed: Negative to environmental allergens and common foods.  Get bloodwork as below.  . Based on clinical history, he likely has chronic idiopathic urticaria. Discussed with patient, that urticaria is usually caused by release of histamine by cutaneous mast cells but sometimes it is non-histamine mediated. Explained that urticaria is not always associated with allergies, and may be related to other infectious or autoimmune causes. In most cases, the exact etiology for urticaria can not be established and it is considered idiopathic. Marland Kitchen. Meanwhile start the following medications: zyrtec 10mg  once a day and may increase to 20mg  twice a day if needed. . Avoid the following potential triggers: alcohol, tight clothing, NSAIDs.

## 2018-06-16 ENCOUNTER — Telehealth: Payer: Self-pay

## 2018-06-16 LAB — CBC WITH DIFFERENTIAL
Basophils Absolute: 0 10*3/uL (ref 0.0–0.2)
Basos: 1 %
EOS (ABSOLUTE): 0.3 10*3/uL (ref 0.0–0.4)
Eos: 6 %
Hematocrit: 42 % (ref 37.5–51.0)
Hemoglobin: 14.3 g/dL (ref 13.0–17.7)
IMMATURE GRANULOCYTES: 1 %
Immature Grans (Abs): 0 10*3/uL (ref 0.0–0.1)
Lymphocytes Absolute: 1.5 10*3/uL (ref 0.7–3.1)
Lymphs: 33 %
MCH: 29.1 pg (ref 26.6–33.0)
MCHC: 34 g/dL (ref 31.5–35.7)
MCV: 85 fL (ref 79–97)
MONOCYTES: 12 %
MONOS ABS: 0.6 10*3/uL (ref 0.1–0.9)
Neutrophils Absolute: 2.2 10*3/uL (ref 1.4–7.0)
Neutrophils: 47 %
RBC: 4.92 x10E6/uL (ref 4.14–5.80)
RDW: 12.7 % (ref 12.3–15.4)
WBC: 4.6 10*3/uL (ref 3.4–10.8)

## 2018-06-16 LAB — COMPREHENSIVE METABOLIC PANEL
ALT: 27 IU/L (ref 0–44)
AST: 20 IU/L (ref 0–40)
Albumin/Globulin Ratio: 2.1 (ref 1.2–2.2)
Albumin: 4.5 g/dL (ref 3.5–5.5)
Alkaline Phosphatase: 62 IU/L (ref 39–117)
BUN/Creatinine Ratio: 20 (ref 9–20)
BUN: 18 mg/dL (ref 6–24)
Bilirubin Total: 0.2 mg/dL (ref 0.0–1.2)
CALCIUM: 9.8 mg/dL (ref 8.7–10.2)
CO2: 19 mmol/L — AB (ref 20–29)
CREATININE: 0.9 mg/dL (ref 0.76–1.27)
Chloride: 104 mmol/L (ref 96–106)
GFR calc Af Amer: 116 mL/min/{1.73_m2} (ref >59)
GFR, EST NON AFRICAN AMERICAN: 101 mL/min/{1.73_m2} (ref >59)
Globulin, Total: 2.1 g/dL (ref 1.5–4.5)
Glucose: 100 mg/dL — ABNORMAL HIGH (ref 65–99)
Potassium: 4.5 mmol/L (ref 3.5–5.2)
Sodium: 139 mmol/L (ref 134–144)
Total Protein: 6.6 g/dL (ref 6.0–8.5)

## 2018-06-16 LAB — ALPHA-GAL PANEL
Alpha Gal IgE*: 1.64 kU/L — ABNORMAL HIGH (ref <0.10)
Beef (Bos spp) IgE: 1.14 kU/L — ABNORMAL HIGH (ref <0.35)
Class Interpretation: 2
Lamb/Mutton (Ovis spp) IgE: 0.22 kU/L (ref <0.35)
PORK (SUS SPP) IGE: 0.48 kU/L — AB (ref <0.35)
PORK CLASS INTERPRETATION: 1

## 2018-06-16 LAB — C-REACTIVE PROTEIN: CRP: 1 mg/L (ref 0–10)

## 2018-06-16 LAB — SEDIMENTATION RATE: Sed Rate: 3 mm/hr (ref 0–15)

## 2018-06-16 LAB — C1 ESTERASE INHIBITOR: C1INH SerPl-mCnc: 26 mg/dL (ref 21–39)

## 2018-06-16 LAB — C3 AND C4
COMPLEMENT C3, SERUM: 112 mg/dL (ref 82–167)
COMPLEMENT C4, SERUM: 16 mg/dL (ref 14–44)

## 2018-06-16 LAB — C1 ESTERASE INHIBITOR, FUNCTIONAL: C1INH Functional/C1INH Total MFr SerPl: 83 %mean normal

## 2018-06-16 LAB — THYROID CASCADE PROFILE: TSH: 1.39 u[IU]/mL (ref 0.450–4.500)

## 2018-06-16 LAB — ANA W/REFLEX: ANA: NEGATIVE

## 2018-06-16 NOTE — Telephone Encounter (Signed)
Patient called back to ask about length of time he should expect this to last and how often will his levels be checked. I explained that we generally check the levels once a year and go from there depending on the results. I let him know to continue the cetirizine until he is seen back in the office. He did acknowledge understanding of this and of the upcoming appointment in February.

## 2018-07-26 ENCOUNTER — Encounter: Payer: Self-pay | Admitting: Allergy and Immunology

## 2018-07-26 ENCOUNTER — Ambulatory Visit (INDEPENDENT_AMBULATORY_CARE_PROVIDER_SITE_OTHER): Payer: 59 | Admitting: Allergy and Immunology

## 2018-07-26 VITALS — BP 128/88 | HR 67 | Resp 14

## 2018-07-26 DIAGNOSIS — T7800XA Anaphylactic reaction due to unspecified food, initial encounter: Secondary | ICD-10-CM | POA: Insufficient documentation

## 2018-07-26 DIAGNOSIS — T7800XD Anaphylactic reaction due to unspecified food, subsequent encounter: Secondary | ICD-10-CM | POA: Diagnosis not present

## 2018-07-26 DIAGNOSIS — T783XXD Angioneurotic edema, subsequent encounter: Secondary | ICD-10-CM | POA: Diagnosis not present

## 2018-07-26 DIAGNOSIS — L509 Urticaria, unspecified: Secondary | ICD-10-CM

## 2018-07-26 MED ORDER — EPINEPHRINE 0.3 MG/0.3ML IJ SOAJ: 0.3000 mg | INTRAMUSCULAR | 2 refills | Status: AC | PRN

## 2018-07-26 NOTE — Progress Notes (Signed)
    Follow-up Note  RE: William Barton MRN: [id number] DOB: Sep 16, 1970 Date of Office Visit: 07/26/2018  Primary care provider: Gillian Scarce, MD Referring provider: Gillian Scarce, MD  History of present illness: William Barton is a 48 y.o. male with a history of urticaria and angioedema and positive alpha gal presenting today for follow-up.  He was previously seen in this clinic for his initial evaluation on May 24, 2018 by Dr. Selena Batten. He presents today with his wife with multiple questions related to alpha-gal hypersensitivity. Since avoiding mammalian meat he has not had recurrence of hives or angioedema. However, since that time he has been taking cetirizine daily. He notes that he has been having diarrhea daily. He drinks milk daily and takes gelatin capsules daily containing fish oil and ibuprofen.   Assessment and plan: Alpha gal hypersensitivity  Continue careful avoidance of mammalian meat.  A prescription has been provided for epinephrine 0.3 mg autoinjector (Auvi-Q) 2 pack along with instructions for its proper administration.  Given the recent GI symptoms, I have recommended eliminating cows milk and gelatin in the diet as well and monitor for symptom change.  I recommended holding cetirizine to confirm that the urticaria resolution was due to avoidance of mammalian meat rather than antihistamine suppression.   The patient's questions were answered regarding alpha-gal hypersensitivity and he was invited to call with further questions if they arise.  Follow up in December 2020 for retest.   Meds ordered this encounter  Medications  . EPINEPHrine (AUVI-Q) 0.3 mg/0.3 mL IJ SOAJ injection    Sig: Inject 0.3 mLs (0.3 mg total) into the muscle as needed for anaphylaxis.    Dispense:  2 Device    Refill:  2    [phone] (W) [phone] (H    Physical examination: Blood pressure 128/88, pulse 67, resp. rate 14, SpO2 97 %.  General: Alert, interactive, in no acute  distress. Neck: Supple without lymphadenopathy. Lungs: Clear to auscultation without wheezing, rhonchi or rales. CV: Normal S1, S2 without murmurs. Skin: Warm and dry, without lesions or rashes.  The following portions of the patient's history were reviewed and updated as appropriate: allergies, current medications, past family history, past medical history, past social history, past surgical history and problem list.  Allergies as of 07/26/2018      Reactions   Ace Inhibitors Cough   Other reaction(s): Cough (ALLERGY/intolerance) Other reaction(s): Cough (ALLERGY/intolerance)      Medication List       Accurate as of July 26, 2018  1:02 PM. Always use your most recent med list.        amLODipine 10 MG tablet Commonly known as:  NORVASC amlodipine 10 mg tablet   atorvastatin 80 MG tablet Commonly known as:  LIPITOR Pt takes 40 mg twice a week   EPINEPHrine 0.3 mg/0.3 mL Soaj injection Commonly known as:  AUVI-Q Inject 0.3 mLs (0.3 mg total) into the muscle as needed for anaphylaxis.   fenofibrate micronized 134 MG capsule Commonly known as:  LOFIBRA Take 134 mg by mouth daily before breakfast.   Fish Oil 1200 MG Caps Take 1,200 mg by mouth daily.       Allergies  Allergen Reactions  . Ace Inhibitors Cough    Other reaction(s): Cough (ALLERGY/intolerance) Other reaction(s): Cough (ALLERGY/intolerance)     I appreciate the opportunity to take part in [facility] care. Please do not hesitate to contact me with questions.  Sincerely,   R. Jorene Guest, MD

## 2018-07-26 NOTE — Assessment & Plan Note (Signed)
   Continue careful avoidance of mammalian meat.  A prescription has been provided for epinephrine 0.3 mg autoinjector (Auvi-Q) 2 pack along with instructions for its proper administration.  Given the recent GI symptoms, I have recommended eliminating cows milk and gelatin in the diet as well and monitor for symptom change.  I recommended holding cetirizine to confirm that the urticaria resolution was due to avoidance of mammalian meat rather than antihistamine suppression.   The patient's questions were answered regarding alpha-gal hypersensitivity and he was invited to call with further questions if they arise.  Follow up in December 2020 for retest.

## 2018-07-26 NOTE — Patient Instructions (Signed)
Alpha gal hypersensitivity  Continue careful avoidance of mammalian meat.  A prescription has been provided for epinephrine 0.3 mg autoinjector (Auvi-Q) 2 pack along with instructions for its proper administration.  Given the recent GI symptoms, I have recommended eliminating cows milk and gelatin in the diet as well and monitor for symptom change.  I recommended holding cetirizine to confirm that the urticaria resolution was due to avoidance of mammalian meat rather than antihistamine suppression.   The patient's questions were answered regarding alpha-gal hypersensitivity and he was invited to call with further questions if they arise.  Follow up in December 2020 for retest.   Return in about 10 months (December 2020), or if symptoms worsen or fail to improve.

## 2019-01-19 ENCOUNTER — Telehealth: Payer: Self-pay

## 2019-01-19 NOTE — Telephone Encounter (Signed)

## 2019-01-20 ENCOUNTER — Other Ambulatory Visit: Payer: Self-pay

## 2019-01-20 ENCOUNTER — Ambulatory Visit (INDEPENDENT_AMBULATORY_CARE_PROVIDER_SITE_OTHER): Payer: 59 | Admitting: Plastic Surgery

## 2019-01-20 ENCOUNTER — Encounter: Payer: Self-pay | Admitting: Plastic Surgery

## 2019-01-20 VITALS — BP 150/90 | HR 67 | Temp 98.0°F | Ht 69.0 in | Wt 225.4 lb

## 2019-01-20 DIAGNOSIS — I10 Essential (primary) hypertension: Secondary | ICD-10-CM

## 2019-01-20 DIAGNOSIS — N63 Unspecified lump in unspecified breast: Secondary | ICD-10-CM | POA: Insufficient documentation

## 2019-01-20 NOTE — Progress Notes (Signed)
Patient ID: Dale DurhamWilliam Cardosa, male    DOB: [birth year], 48 y.o.   MRN: [id number]   Chief Complaint  Patient presents with  . Breast Problem    The patient is a 48 year old male here for evaluation of his left breast.  He is 5 feet 9 inches tall and weighs 225 pounds today but has on steel toe boots so is likely a little bit less.  He has hypertension and hypercholesterolemia.  He is otherwise in good health and works daily.  Over the past several years he is noted to lumps in his left medial breast.  They seem to change with time.  Sometimes he feels like his whole breast is swollen.  The patient says that he has noticed similar lumps around his body before.  He has a maternal grandmother with history of breast cancer.  Nobody else in the family that he knows of has suffered from breast cancer.  There is been no other changes that he has noticed.  The area is a little bit tender to palpation.  There on the medial inferior aspect of the left breast adjacent to each other.  One is 2 x 2 cm the other is 2.5 x 3.5 cm.  There does not appear to be any skin involvement.   Review of Systems  Constitutional: Negative for activity change and appetite change.  HENT: Negative for dental problem.   Eyes: Negative.   Respiratory: Negative for chest tightness, shortness of breath and stridor.   Cardiovascular: Negative for leg swelling.  Gastrointestinal: Negative for abdominal distention.  Endocrine: Negative.   Genitourinary: Negative.   Musculoskeletal: Negative.   Allergic/Immunologic: Negative.        Intermittently urticaria  Neurological: Negative.   Psychiatric/Behavioral: Negative.     Past Medical History:  Diagnosis Date  . Allergy to alpha-gal   . 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 Bell's palsy   . Chicken pox   . Coronary artery calcification seen on CAT scan 12/24/2017  . Generalized headaches   . GERD (gastroesophageal reflux disease)   . Hyperlipidemia   .  Hypertension   . Hypertriglyceridemia   . OSA (obstructive sleep apnea)    Intolerant to CPAP therapy and now using an oral device    Past Surgical History:  Procedure Laterality Date  . APPENDECTOMY  1981  . EYE SURGERY     1975  . KNEE ARTHROSCOPY Left    2016  . NASAL SEPTUM SURGERY  05/07/2016  . TONSILLECTOMY  1975      Current Outpatient Medications:  .  amLODipine (NORVASC) 10 MG tablet, amlodipine 10 mg tablet, Disp: , Rfl:  .  atorvastatin (LIPITOR) 80 MG tablet, Pt takes 40 mg twice a week, Disp: , Rfl:  .  EPINEPHrine (AUVI-Q) 0.3 mg/0.3 mL IJ SOAJ injection, Inject 0.3 mLs (0.3 mg total) into the muscle as needed for anaphylaxis., Disp: 2 Device, Rfl: 2 .  famotidine (PEPCID) 40 MG tablet, Take 1 tablet by mouth daily., Disp: , Rfl:  .  fenofibrate micronized (LOFIBRA) 134 MG capsule, Take 134 mg by mouth daily before breakfast., Disp: , Rfl:    Objective:   Vitals:   01/20/19 0912  BP: (!) 150/90  Pulse: 67  Temp: 98 F (36.7 C)  SpO2: 97%    Physical Exam Vitals signs and nursing note reviewed.  Constitutional:      Appearance: Normal appearance.  HENT:     Head: Normocephalic and atraumatic.  Eyes:  Extraocular Movements: Extraocular movements intact.     Pupils: Pupils are equal, round, and reactive to light.  Cardiovascular:     Rate and Rhythm: Normal rate.     Pulses: Normal pulses.  Pulmonary:     Effort: Pulmonary effort is normal. No respiratory distress.     Breath sounds: No wheezing.  Abdominal:     General: Abdomen is flat. There is no distension.     Tenderness: There is no abdominal tenderness.  Musculoskeletal: Normal range of motion.  Skin:    General: Skin is warm.  Neurological:     General: No focal deficit present.     Mental Status: He is alert and oriented to person, place, and time.  Psychiatric:        Mood and Affect: Mood normal.        Behavior: Behavior normal.        Thought Content: Thought content normal.         Judgment: Judgment normal.     Assessment & Plan:  Benign essential HTN  Breast mass in male  Recommend ultrasound-guided fine-needle aspiration of the lesion followed by excision.  Most likely a cyst or a lipoma.  But due to the location I want to be sure this is not cancerous in nature.  The patient is in agreement. Pictures were obtained of the patient and placed in the chart with the patient's or guardian's permission.  [place], DO

## 2019-01-22 IMAGING — CT CT HEAD W/O CM
3 series · 16 of 47 positions shown, 19 images · non-contrast
Comparison: 02/06/2015

CLINICAL DATA: Frontal headaches, hypertension, dizziness, blurred
vision

EXAM:
CT HEAD WITHOUT CONTRAST
TECHNIQUE: Contiguous axial images were obtained from the base of the skull
through the vertex without intravenous contrast.

[Series 2: head wo · axial · 0.47mm/px · z∈[-178,-33]mm · 10 of 35 slices shown, 13 images]
[im 3/35  brain]
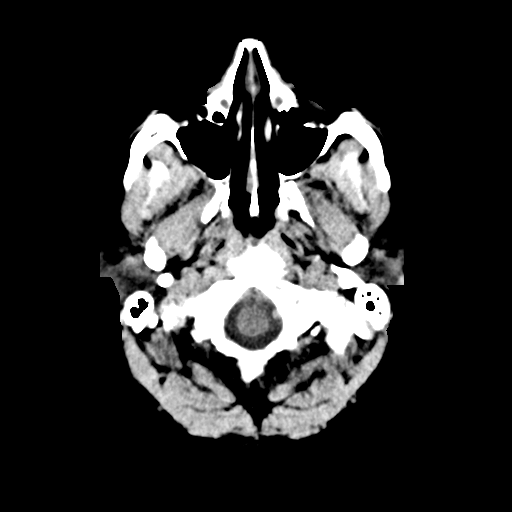
[im 3/35  bone]
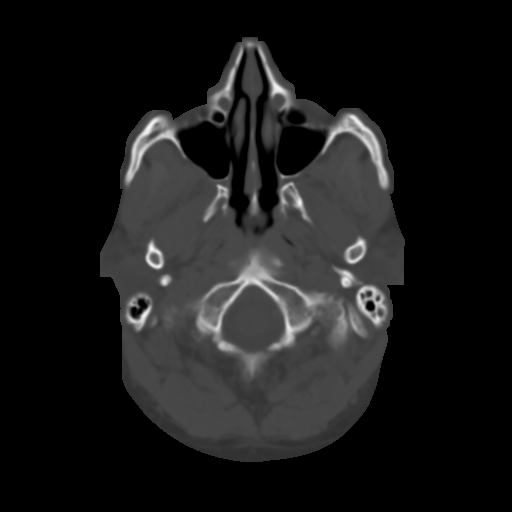
[im 6/35  brain]
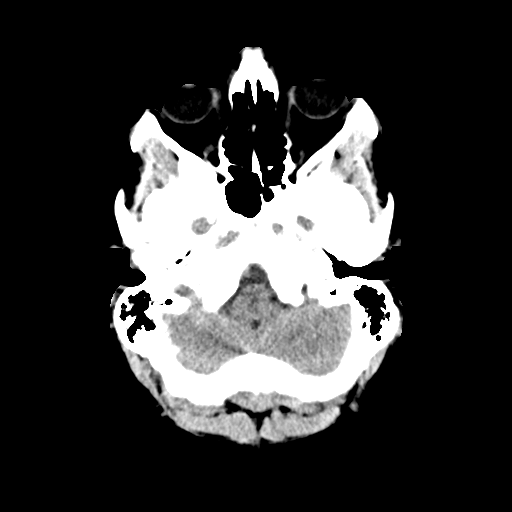
[im 10/35  brain]
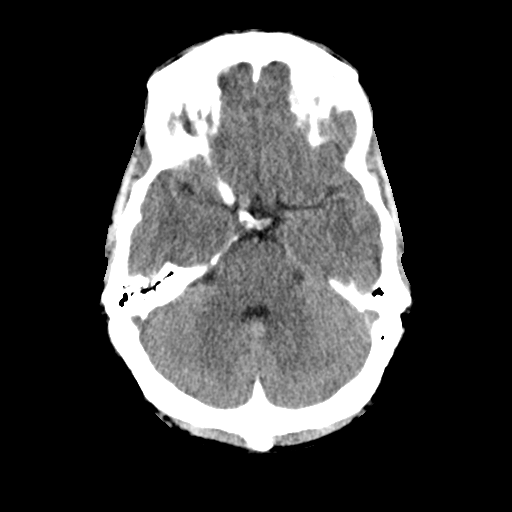
[im 12/35  brain]
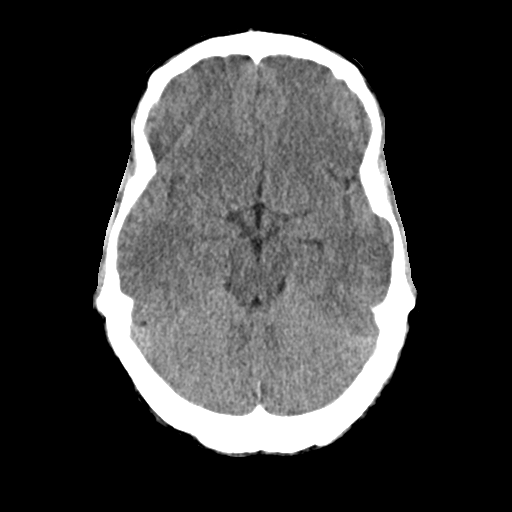
[im 16/35  brain]
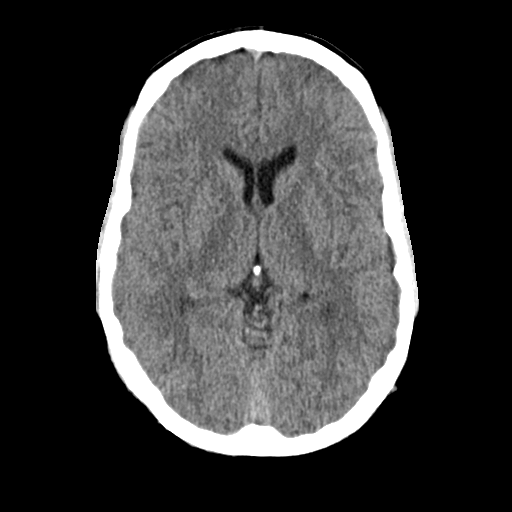
[im 16/35  bone]
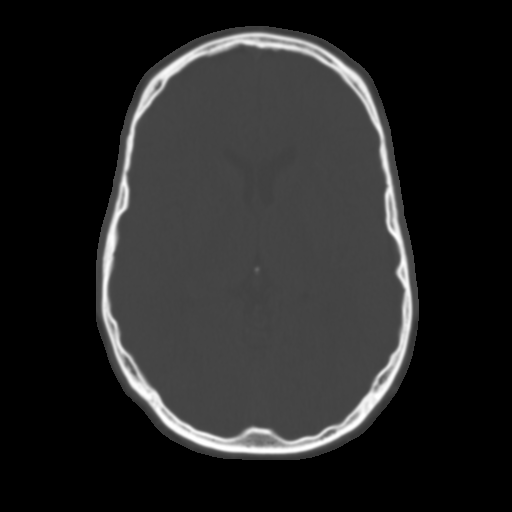
[im 19/35  brain]
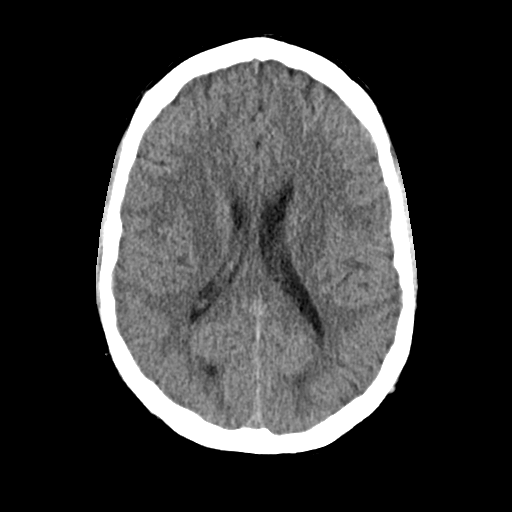
[im 23/35  brain]
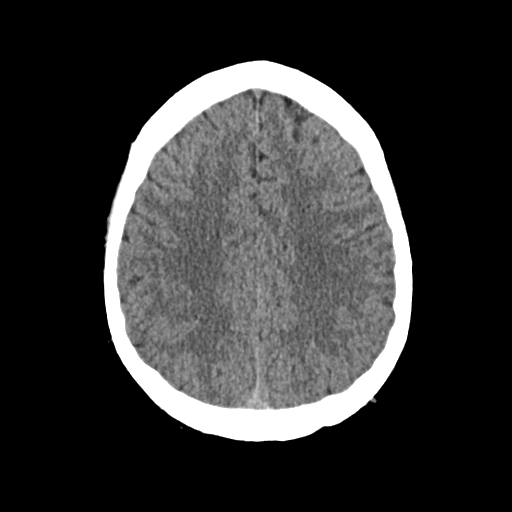
[im 26/35  brain]
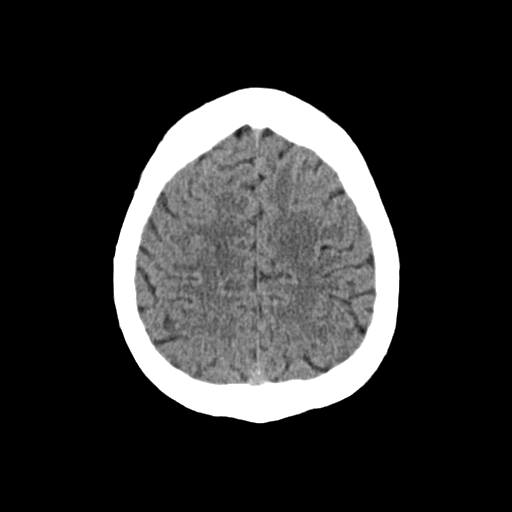
[im 29/35  brain]
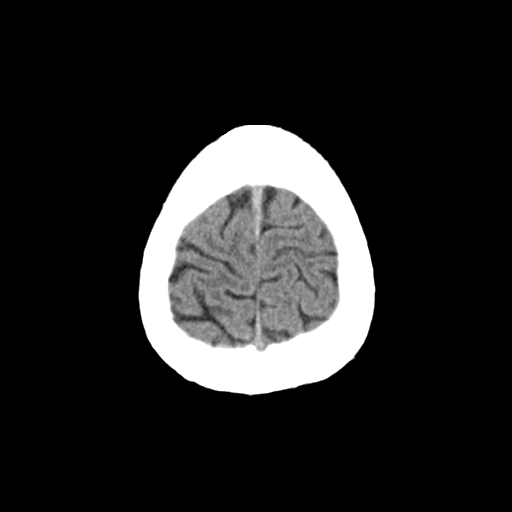
[im 29/35  bone]
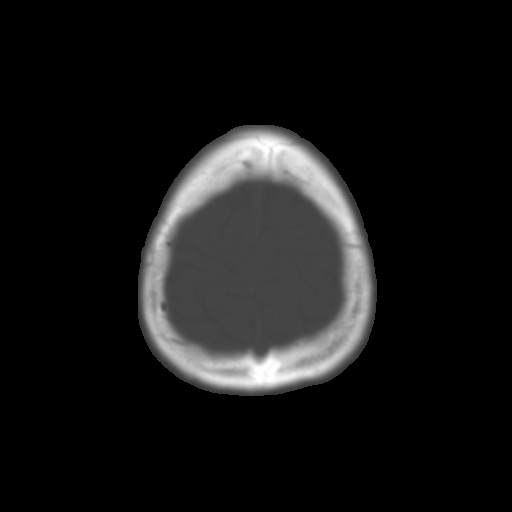
[im 32/35  brain]
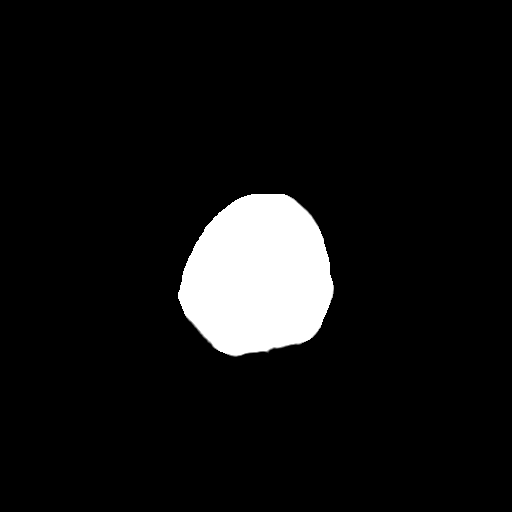

[Series 4: coronal soft · coronal · 0.41mm/px · 3 of 70 slices shown]
[im 24/70  brain]
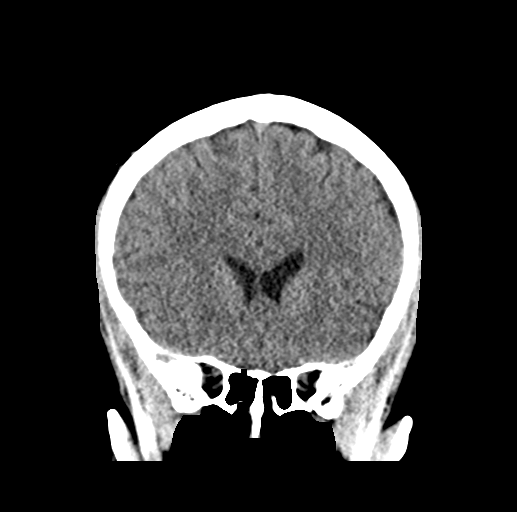
[im 31/70  brain]
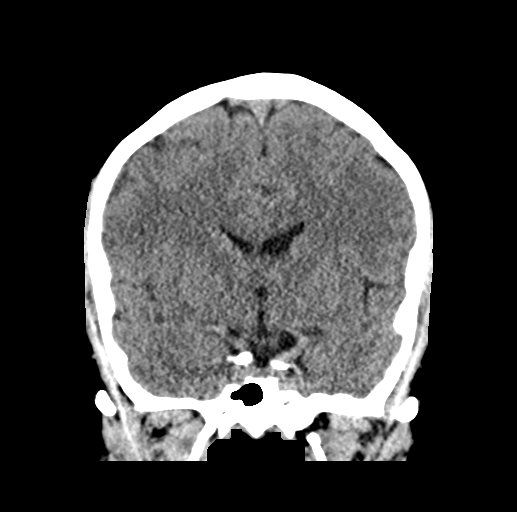
[im 39/70  brain]
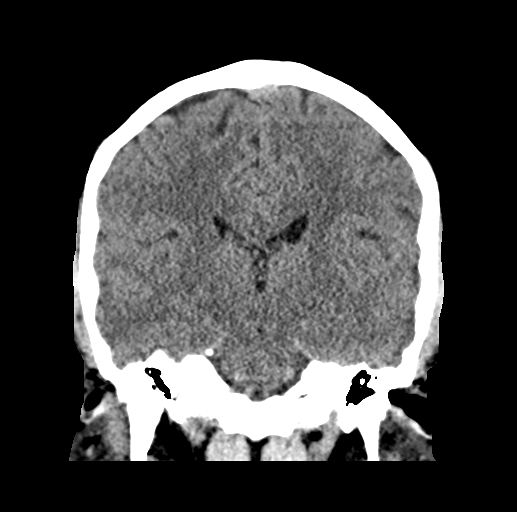

[Series 5: sag soft · sagittal · 0.41mm/px · 3 of 62 slices shown]
[im 21/62  brain]
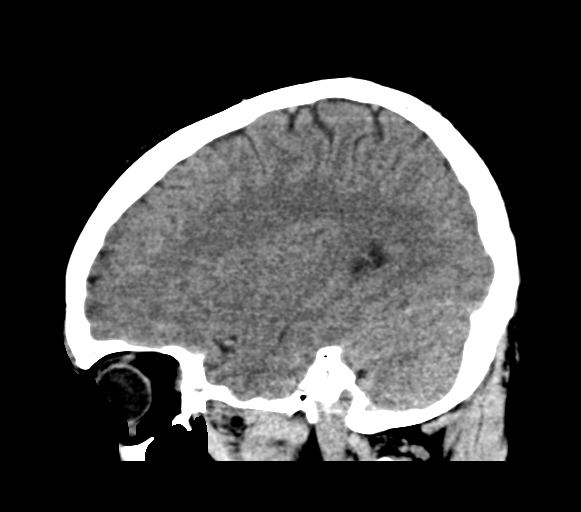
[im 31/62  brain]
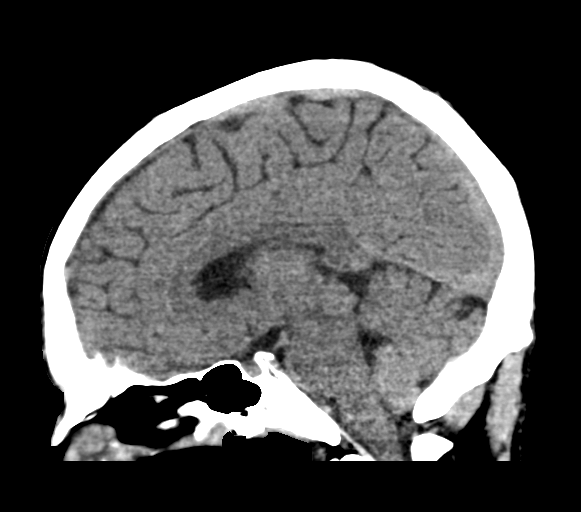
[im 41/62  brain]
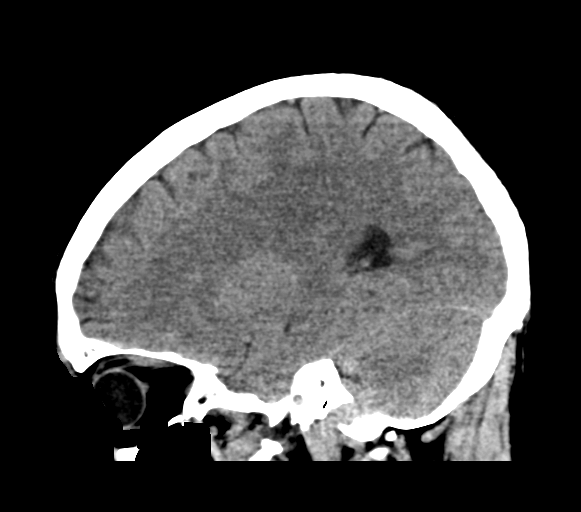

[16 of 47 positions shown; findings below may reference images not displayed]

FINDINGS: Brain: No evidence of acute infarction, hemorrhage, hydrocephalus,
extra-axial collection or mass lesion/mass effect.

Vascular: No hyperdense vessel or unexpected calcification.

Skull: Normal. Negative for fracture or focal lesion.

Sinuses/Orbits: No acute finding.

Other: None.
IMPRESSION: Normal head CT without contrast

## 2019-01-26 ENCOUNTER — Other Ambulatory Visit: Payer: Self-pay | Admitting: Plastic Surgery

## 2019-01-26 DIAGNOSIS — N63 Unspecified lump in unspecified breast: Secondary | ICD-10-CM

## 2019-02-10 ENCOUNTER — Ambulatory Visit
Admission: RE | Admit: 2019-02-10 | Discharge: 2019-02-10 | Disposition: A | Discharge status: Home / Self Care | Payer: 59 | Source: Ambulatory Visit | Attending: Plastic Surgery | Admitting: Plastic Surgery

## 2019-02-10 ENCOUNTER — Other Ambulatory Visit: Payer: Self-pay

## 2019-02-10 ENCOUNTER — Other Ambulatory Visit: Payer: Self-pay | Admitting: Plastic Surgery

## 2019-02-10 DIAGNOSIS — N63 Unspecified lump in unspecified breast: Secondary | ICD-10-CM

## 2019-03-01 ENCOUNTER — Ambulatory Visit: Payer: 59 | Admitting: Cardiology

## 2019-11-27 ENCOUNTER — Ambulatory Visit (INDEPENDENT_AMBULATORY_CARE_PROVIDER_SITE_OTHER): Payer: 59 | Admitting: Cardiology

## 2019-11-27 ENCOUNTER — Other Ambulatory Visit: Payer: Self-pay

## 2019-11-27 ENCOUNTER — Encounter: Payer: Self-pay | Admitting: Cardiology

## 2019-11-27 VITALS — BP 140/80 | HR 80 | Ht 69.0 in | Wt 225.0 lb

## 2019-11-27 DIAGNOSIS — I10 Essential (primary) hypertension: Secondary | ICD-10-CM

## 2019-11-27 DIAGNOSIS — I77810 Thoracic aortic ectasia: Secondary | ICD-10-CM | POA: Diagnosis not present

## 2019-11-27 DIAGNOSIS — E7800 Pure hypercholesterolemia, unspecified: Secondary | ICD-10-CM

## 2019-11-27 DIAGNOSIS — I2510 Atherosclerotic heart disease of native coronary artery without angina pectoris: Secondary | ICD-10-CM

## 2019-11-27 MED ORDER — ATORVASTATIN CALCIUM 40 MG PO TABS
40.0000 mg | ORAL_TABLET | Freq: Every day | ORAL | 3 refills | Status: DC
Start: 2019-11-27 — End: 2021-01-03

## 2019-11-27 NOTE — Addendum Note (Signed)
Addended by: Theresia Majors on: 11/27/2019 04:06 PM   Modules accepted: Orders

## 2019-11-27 NOTE — Progress Notes (Addendum)
Cardiology Office Note:    Date:  11/27/2019   ID:  Lorna Few, DOB [birth year], MRN [id number]  PCP:  Jonathon Resides, MD  Cardiologist:  No primary care provider on file.    Referring MD: Jonathon Resides, MD   Chief Complaint  Patient presents with  . Follow-up    coronary artery calcifications, OSA, dilated aorta    History of Present Illness:    William Barton is a 49 y.o. male with a hx of hypertension, hyperlipidemia, fm hx of CAD and coronary artery calcifications on Chest CT with Coronary calcium score was 14 which was 15 percentile for age and sex matched controls.  He was also noted to have a dilated a sending aorta at 3.9 mm.  He has a hx of sleep apnea by his PCP and did not tolerate CPAP and is now using a oral device.  He is here today for followup and is doing well.  He tells me that he has had a knot under his left breast for a while after he wore sunglasses over his shirt and developed skin irritation and had a mammogram done and felt it was benign.  It was painful at times.  Now he has has been having discomfort over his chest that he has a very had time describing.  He says it feels like pressure sometimes like gas but then at other times he thinks it feels like blood rushing through his chest.  He has no associated sx of diaphoresis, nausea or SOB.  He is not sure if it could be an irregular heart beat.  It is nonexertional.  He denies any  SOB, DOE, PND, orthopnea, LE edema, dizziness, palpitations or syncope. He is compliant with his meds and is tolerating meds with no SE.    Past Medical History:  Diagnosis Date  . Allergy to alpha-gal   . 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 Bell's palsy   . Chicken pox   . Coronary artery calcification seen on CAT scan 12/24/2017  . Generalized headaches   . GERD (gastroesophageal reflux disease)   . Hyperlipidemia   . Hypertension   . Hypertriglyceridemia   . OSA (obstructive sleep apnea)    Intolerant to CPAP  therapy and now using an oral device    Past Surgical History:  Procedure Laterality Date  . APPENDECTOMY  1981  . EYE SURGERY     1975  . KNEE ARTHROSCOPY Left    2016  . NASAL SEPTUM SURGERY  05/07/2016  . TONSILLECTOMY  1975    Current Medications: Current Meds  Medication Sig  . amLODipine (NORVASC) 10 MG tablet amlodipine 10 mg tablet  . atorvastatin (LIPITOR) 80 MG tablet Pt takes 40 mg twice a week  . EPINEPHrine (AUVI-Q) 0.3 mg/0.3 mL IJ SOAJ injection Inject 0.3 mLs (0.3 mg total) into the muscle as needed for anaphylaxis.  . famotidine (PEPCID) 40 MG tablet Take 1 tablet by mouth daily.  . fenofibrate micronized (LOFIBRA) 134 MG capsule Take 134 mg by mouth daily before breakfast.     Allergies:   Ace inhibitors   Social History   Socioeconomic History  . Marital status: Married    Spouse name: TRACEY  . Number of children: 2  . Years of education: 61  . Highest education level: Not on file  Occupational History  . Occupation: [employer]  Tobacco Use  . Smoking status: Never Smoker  . Smokeless tobacco: Never Used  Vaping Use  .  Vaping Use: Never used  Substance and Sexual Activity  . Alcohol use: Yes  . Drug use: No  . Sexual activity: Yes    Partners: Female  Other Topics Concern  . Not on file  Social History Narrative  . Not on file   Social Determinants of Health   Financial Resource Strain:   . Difficulty of Paying Living Expenses:   Food Insecurity:   . Worried About Programme researcher, broadcasting/film/video in the Last Year:   . Barista in the Last Year:   Transportation Needs:   . Freight forwarder (Medical):   Marland Kitchen Lack of Transportation (Non-Medical):   Physical Activity:   . Days of Exercise per Week:   . Minutes of Exercise per Session:   Stress:   . Feeling of Stress :   Social Connections:   . Frequency of Communication with Friends and Family:   . Frequency of Social Gatherings with Friends and Family:   . Attends Religious  Services:   . Active Member of Clubs or Organizations:   . Attends Banker Meetings:   Marland Kitchen Marital Status:      Family History: The patient's family history includes Breast cancer in his maternal grandmother; Heart disease in his mother; Hypertension in his maternal grandmother; Prostate cancer in his paternal grandfather. none ROS:   Please see the history of present illness.    ROS  All other systems reviewed and negative.   EKGs/Labs/Other Studies Reviewed:    The following studies were reviewed today: EKG  EKG:  EKG is  ordered today.  The ekg ordered today demonstrates Sinus bradycardia at 48 bpm with no ST-T wave at normalities.  Recent Labs: No results found for requested labs within last 8760 hours.   Recent Lipid Panel    Component Value Date/Time   CHOL 123 02/08/2018 0804   TRIG 82 02/08/2018 0804   HDL 43 02/08/2018 0804   CHOLHDL 2.9 02/08/2018 0804   CHOLHDL 6.0 06/02/2010 0840   VLDL 63 (H) 06/02/2010 0840   LDLCALC 64 02/08/2018 0804    Physical Exam:    VS:  BP 140/80   Pulse 80   Ht 5\' 9"  (1.753 m)   Wt 225 lb (102.1 kg)   SpO2 97%   BMI 33.23 kg/m     Wt Readings from Last 3 Encounters:  11/27/19 225 lb (102.1 kg)  01/20/19 225 lb 6.4 oz (102.2 kg)  05/24/18 218 lb (98.9 kg)     GEN:  Well nourished, well developed in no acute distress HEENT: Normal NECK: No JVD; No carotid bruits LYMPHATICS: No lymphadenopathy CARDIAC: RRR, no murmurs, rubs, gallops RESPIRATORY:  Clear to auscultation without rales, wheezing or rhonchi  ABDOMEN: Soft, non-tender, non-distended MUSCULOSKELETAL:  No edema; No deformity  SKIN: Warm and dry NEUROLOGIC:  Alert and oriented x 3 PSYCHIATRIC:  Normal affect   ASSESSMENT:    1. Coronary artery calcification seen on CAT scan   2. Benign essential HTN   3. Pure hypercholesterolemia   4. Ascending aorta dilatation (HCC)    PLAN:    In order of problems listed above:  1.  Coronary artery  calcifications  -calcium score was 14 which places patient in the 82nd percentile for age and sex matched controls.   -Exercise treadmill test 2019 showed no inducible ischemia.   -he denies any anginal sz but has a funny sensation in his chest at time of unclear etiology>>may be an arrhythmia -continue  statin -repeat ETT to look for silent ischemia -will get 30 day heart monitor to assess for arrhythmia  2.  Hypertension  -BP controlled on exam -continue on Amlodipine 10mg  daily  3.  Hyperlipidemia  -LDL goal is less than 70.   -LDL was 96 in Oct 2020 -currently taking atorvastatin 40mg  twice weekly and fenofibrate 134 mg daily. -I will increase Atorvastatin to 40mg  daily and repeat FLP and ALT in 6 weeks  4.  Dilated ascending aorta  -noted on CT scan in 2018 with ascending aorta 40mm -2D echo 2019 with no dilatation -repeat limited echo to reassess  Medication Adjustments/Labs and Tests Ordered: Current medicines are reviewed at length with the patient today.  Concerns regarding medicines are outlined above.  No orders of the defined types were placed in this encounter.  No orders of the defined types were placed in this encounter.   Signed, 2019, MD  11/27/2019 3:48 PM    [facility]

## 2019-11-27 NOTE — Patient Instructions (Addendum)
  Medication Instructions:  Your physician has recommended you make the following change in your medication:  1) INCREASE atorvastatin to 40 mg daily  *If you need a refill on your cardiac medications before your next appointment, please call your pharmacy*   Lab Work: Fasting lipid panel and ALT in 6 weeks. If you have labs (blood work) drawn today and your tests are completely normal, you will receive your results only by: Marland Kitchen MyChart Message (if you have MyChart) OR . A paper copy in the mail If you have any lab test that is abnormal or we need to change your treatment, we will call you to review the results.   Testing/Procedures: Your physician has requested that you have an exercise tolerance test. For further information please visit [email or web address] Please also follow instruction sheet, as given.  Your physician has requested that you have an echocardiogram. Echocardiography is a painless test that uses sound waves to create images of your heart. It provides your doctor with information about the size and shape of your heart and how well your heart's chambers and valves are working. This procedure takes approximately one hour. There are no restrictions for this procedure.  Your physician has recommended that you wear an event monitor. Event monitors are medical devices that record the heart's electrical activity. Doctors most often [place] these monitors to diagnose arrhythmias. Arrhythmias are problems with the speed or rhythm of the heartbeat. The monitor is a small, portable device. You can wear one while you do your normal daily activities. This is usually used to diagnose what is causing palpitations/syncope (passing out).  Follow-Up: At [facility], you and your health needs are our priority.  As part of our continuing mission to provide you with exceptional heart care, we have created designated Provider Care Teams.  These Care Teams include your primary Cardiologist (physician)  and Advanced Practice Providers (APPs -  Physician Assistants and Nurse Practitioners) who all work together to provide you with the care you need, when you need it.  We recommend signing up for the patient portal called "MyChart".  Sign up information is provided on this After Visit Summary.  MyChart is used to connect with patients for Virtual Visits (Telemedicine).  Patients are able to view lab/test results, encounter notes, upcoming appointments, etc.  Non-urgent messages can be sent to your provider as well.   To learn more about what you can do with MyChart, go to [email or web address].    Your next appointment:   1 year(s)  The format for your next appointment:   In Person  Provider:   You may see Armanda Magic, MD or one of the following Advanced Practice Providers on your designated Care Team:    Ronie Spies, PA-C  Jacolyn Reedy, PA-C

## 2019-11-28 ENCOUNTER — Telehealth: Payer: Self-pay | Admitting: Radiology

## 2019-11-28 NOTE — Telephone Encounter (Signed)
Enrolled patient for a 30 day [employer] Event Monitor to be mailed to patients home  

## 2019-12-26 ENCOUNTER — Other Ambulatory Visit: Payer: Self-pay

## 2019-12-26 ENCOUNTER — Ambulatory Visit (HOSPITAL_COMMUNITY): Payer: 59 | Attending: Cardiology

## 2019-12-26 ENCOUNTER — Ambulatory Visit (INDEPENDENT_AMBULATORY_CARE_PROVIDER_SITE_OTHER): Payer: 59

## 2019-12-26 DIAGNOSIS — I10 Essential (primary) hypertension: Secondary | ICD-10-CM

## 2019-12-26 DIAGNOSIS — E7800 Pure hypercholesterolemia, unspecified: Secondary | ICD-10-CM

## 2019-12-26 DIAGNOSIS — I77810 Thoracic aortic ectasia: Secondary | ICD-10-CM

## 2019-12-26 DIAGNOSIS — I2510 Atherosclerotic heart disease of native coronary artery without angina pectoris: Secondary | ICD-10-CM

## 2019-12-26 LAB — EXERCISE TOLERANCE TEST
Estimated workload: 13.7 METS
Exercise duration (min): 11 min
Exercise duration (sec): 0 s
MPHR: 170 {beats}/min
Peak HR: 160 {beats}/min
Percent HR: 94 %
RPE: 15
Rest HR: 81 {beats}/min

## 2019-12-26 LAB — ECHOCARDIOGRAM COMPLETE
Area-P 1/2: 3.31 cm2
S' Lateral: 3 cm

## 2019-12-28 ENCOUNTER — Telehealth: Payer: Self-pay | Admitting: Cardiology

## 2019-12-28 ENCOUNTER — Telehealth: Payer: Self-pay

## 2019-12-28 DIAGNOSIS — I77810 Thoracic aortic ectasia: Secondary | ICD-10-CM

## 2019-12-28 NOTE — Telephone Encounter (Signed)
The patient has been notified of the result and verbalized understanding.  All questions (if any) were answered. Theresia Majors, RN 12/28/2019 2:16 PM

## 2019-12-28 NOTE — Telephone Encounter (Signed)
-----   Message from Quintella Reichert, MD sent at 12/26/2019  7:05 PM EDT ----- Echo showed normal heart function and mildly thickened heart muscle and mildly dilated ascending aorta at 52mm>>repeat echo in 1 year for dilated aorta

## 2019-12-28 NOTE — Telephone Encounter (Signed)
Transferred call to [place]. 

## 2019-12-29 ENCOUNTER — Telehealth: Payer: Self-pay

## 2019-12-29 DIAGNOSIS — I10 Essential (primary) hypertension: Secondary | ICD-10-CM

## 2019-12-29 NOTE — Telephone Encounter (Signed)
-----   Message from Quintella Reichert, MD sent at 12/28/2019  4:31 PM EDT ----- Please get a 24 hour BP monitor  Traci ----- Message ----- From: Theresia Majors, RN Sent: 12/28/2019   2:16 PM EDT To: Quintella Reichert, MD  The patient has been notified of the result and verbalized understanding.  All questions (if any) were answered. Theresia Majors, RN 12/28/2019 2:16 PM   Patient had not taken his amlodipine prior to the test.

## 2019-12-29 NOTE — Telephone Encounter (Signed)
Left message for patient that Dr. Mayford Knife would like him to wear a 24 hour BP monitor. Advised to call back with any questions.

## 2020-01-03 ENCOUNTER — Telehealth: Payer: Self-pay | Admitting: *Deleted

## 2020-01-03 NOTE — Telephone Encounter (Signed)
Please call [phone] ask to speak with [facility] in monitor department to set up appointment for 24 hour ambulatory b/p monitor.  We could have you come in Monday 01/08/2020 at 9:00-9:30 to have monitor applied and then you could return it when you have your labs drawn 01/09/2020 at 9:45.

## 2020-01-05 ENCOUNTER — Encounter: Payer: Self-pay | Admitting: Cardiology

## 2020-01-05 NOTE — Telephone Encounter (Signed)
error 

## 2020-01-05 NOTE — Telephone Encounter (Signed)
Follow up  ° ° °Pt is returning call  ° ° °Please call back  °

## 2020-01-08 NOTE — Telephone Encounter (Signed)
Patient does not think he will be able to do the 24 hour ambulatory blood pressure monitor.  It would interfere with his outside job.  Patient want to check with Dr. Mayford Knife about monitoring his own blood pressure himself.  He has a blood pressure unit and his wife is a Engineer, civil (consulting).

## 2020-01-09 ENCOUNTER — Other Ambulatory Visit: Payer: 59

## 2020-01-09 ENCOUNTER — Other Ambulatory Visit: Payer: Self-pay

## 2020-01-09 DIAGNOSIS — E7800 Pure hypercholesterolemia, unspecified: Secondary | ICD-10-CM

## 2020-01-09 DIAGNOSIS — I2510 Atherosclerotic heart disease of native coronary artery without angina pectoris: Secondary | ICD-10-CM

## 2020-01-09 DIAGNOSIS — I10 Essential (primary) hypertension: Secondary | ICD-10-CM

## 2020-01-09 DIAGNOSIS — I77810 Thoracic aortic ectasia: Secondary | ICD-10-CM

## 2020-01-09 LAB — LIPID PANEL
Chol/HDL Ratio: 3.4 ratio (ref 0.0–5.0)
Cholesterol, Total: 145 mg/dL (ref 100–199)
HDL: 43 mg/dL (ref >39)
LDL Chol Calc (NIH): 82 mg/dL (ref 0–99)
Triglycerides: 108 mg/dL (ref 0–149)
VLDL Cholesterol Cal: 20 mg/dL (ref 5–40)

## 2020-01-09 LAB — ALT: ALT: 29 IU/L (ref 0–44)

## 2020-09-25 ENCOUNTER — Ambulatory Visit (INDEPENDENT_AMBULATORY_CARE_PROVIDER_SITE_OTHER): Payer: 59 | Admitting: Podiatry

## 2020-09-25 ENCOUNTER — Other Ambulatory Visit: Payer: Self-pay

## 2020-09-25 ENCOUNTER — Other Ambulatory Visit: Payer: Self-pay | Admitting: Podiatry

## 2020-09-25 ENCOUNTER — Encounter: Payer: Self-pay | Admitting: Podiatry

## 2020-09-25 ENCOUNTER — Ambulatory Visit: Payer: Self-pay

## 2020-09-25 DIAGNOSIS — S90852A Superficial foreign body, left foot, initial encounter: Secondary | ICD-10-CM

## 2020-09-25 DIAGNOSIS — S99922A Unspecified injury of left foot, initial encounter: Secondary | ICD-10-CM

## 2020-09-25 NOTE — Progress Notes (Signed)
Subjective:  Patient ID: William Barton, male    DOB: Mar 26, 1971,  MRN: [id number]  Chief Complaint  Patient presents with  . Injury    Injury to Lt 2nd sub met    50 y.o. male presents with the above complaint.  Patient presents with complaint of left submetatarsal 2 pain secondary to possible foreign body/glass.  Patient stated he stepped on a glass 2 weeks ago and he thought he had had all of it removed.  He still states that there might be a little glass present.  He has an entry wound.  It is not currently infected.  He would like to have it removed.  He states that it is very small and he can feel it when hitting the right spot.  He denies any other acute complaints he has not seen anyone else prior to seeing me.   Review of Systems: Negative except as noted in the HPI. Denies N/V/F/Ch.  Past Medical History:  Diagnosis Date  . Allergy to alpha-gal   . 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 Bell's palsy   . Chicken pox   . Coronary artery calcification seen on CAT scan 12/24/2017  . Generalized headaches   . GERD (gastroesophageal reflux disease)   . Hyperlipidemia   . Hypertension   . Hypertriglyceridemia   . OSA (obstructive sleep apnea)    Intolerant to CPAP therapy and now using an oral device    Current Outpatient Medications:  .  atorvastatin (LIPITOR) 40 MG tablet, Take 1 tablet (40 mg total) by mouth daily., Disp: 90 tablet, Rfl: 3 .  EPINEPHrine (AUVI-Q) 0.3 mg/0.3 mL IJ SOAJ injection, Inject 0.3 mLs (0.3 mg total) into the muscle as needed for anaphylaxis., Disp: 2 Device, Rfl: 2 .  famotidine (PEPCID) 40 MG tablet, Take 1 tablet by mouth daily., Disp: , Rfl:  .  fenofibrate micronized (LOFIBRA) 134 MG capsule, Take 134 mg by mouth daily before breakfast., Disp: , Rfl:  .  losartan (COZAAR) 50 MG tablet, Take 1 tablet by mouth daily., Disp: , Rfl:   Social History   Tobacco Use  Smoking Status Never Smoker  Smokeless Tobacco Never Used    Allergies   Allergen Reactions  . Ace Inhibitors Cough    Other reaction(s): Cough (ALLERGY/intolerance) Other reaction(s): Cough (ALLERGY/intolerance)    Objective:  There were no vitals filed for this visit. There is no height or weight on file to calculate BMI. Constitutional Well developed. Well nourished.  Vascular Dorsalis pedis pulses palpable bilaterally. Posterior tibial pulses palpable bilaterally. Capillary refill normal to all digits.  No cyanosis or clubbing noted. Pedal hair growth normal.  Neurologic Normal speech. Oriented to person, place, and time. Epicritic sensation to light touch grossly present bilaterally.  Dermatologic Nails well groomed and normal in appearance. No open wounds. No skin lesions.  Orthopedic:  Pain on palpation of left submetatarsal 2.  Entry wound noted.  Does not extend past the dermal layer.  No clinical signs of infection noted.  No purulent drainage noted.   Radiographs: Unable to obtain x-rays during the EXTR machine was down Assessment:   1. Foreign body of skin of plantar aspect of left foot    Plan:  Patient was evaluated and treated and all questions answered.  Left submetatarsal 2 foreign body (glass) -I explained to the patient the etiology of pain secondary to foreign body and various treatment options were extensively discussed.  Given that there is a puncture wound I believe patient will  benefit from exploration and removal of foreign body.  Patient states understanding would like to have it removed. Procedure removal of foreign body -The skin was prepped using Betadine solution followed by injection of 1 to 1% of 1% lidocaine plain half percent Marcaine plain 3 cc to provide local anesthesia.  Once it was anesthetized chisel blade was used to open up the puncture wound and at this time sharp forceps were utilized to palpate further glass.  The glass was palpated and removed in its entirety.  No further foreign body/glass noted.  At this  time I dressed the wound with triple antibiotic and a Band-Aid.  No purulent drainage noted.  No clinical signs of infection noted.  Continue applying triple antibiotic and Band-Aid for next week to resolve meant.  No follow-ups on file.

## 2020-10-17 ENCOUNTER — Emergency Department (HOSPITAL_BASED_OUTPATIENT_CLINIC_OR_DEPARTMENT_OTHER)
Admission: EM | Admit: 2020-10-17 | Discharge: 2020-10-17 | Disposition: A | Discharge status: Home / Self Care | Payer: 59 | Attending: Emergency Medicine | Admitting: Emergency Medicine

## 2020-10-17 ENCOUNTER — Encounter (HOSPITAL_BASED_OUTPATIENT_CLINIC_OR_DEPARTMENT_OTHER): Payer: Self-pay | Admitting: Emergency Medicine

## 2020-10-17 ENCOUNTER — Other Ambulatory Visit: Payer: Self-pay

## 2020-10-17 DIAGNOSIS — R059 Cough, unspecified: Secondary | ICD-10-CM | POA: Diagnosis not present

## 2020-10-17 DIAGNOSIS — I10 Essential (primary) hypertension: Secondary | ICD-10-CM | POA: Diagnosis not present

## 2020-10-17 DIAGNOSIS — R0981 Nasal congestion: Secondary | ICD-10-CM | POA: Diagnosis not present

## 2020-10-17 DIAGNOSIS — Z79899 Other long term (current) drug therapy: Secondary | ICD-10-CM | POA: Diagnosis not present

## 2020-10-17 DIAGNOSIS — R5383 Other fatigue: Secondary | ICD-10-CM | POA: Diagnosis not present

## 2020-10-17 DIAGNOSIS — Z20822 Contact with and (suspected) exposure to covid-19: Secondary | ICD-10-CM | POA: Insufficient documentation

## 2020-10-17 DIAGNOSIS — J3489 Other specified disorders of nose and nasal sinuses: Secondary | ICD-10-CM | POA: Insufficient documentation

## 2020-10-17 DIAGNOSIS — R519 Headache, unspecified: Secondary | ICD-10-CM | POA: Diagnosis not present

## 2020-10-17 LAB — CBC WITH DIFFERENTIAL/PLATELET
Abs Immature Granulocytes: 0.02 10*3/uL (ref 0.00–0.07)
Basophils Absolute: 0 10*3/uL (ref 0.0–0.1)
Basophils Relative: 1 %
Eosinophils Absolute: 0.2 10*3/uL (ref 0.0–0.5)
Eosinophils Relative: 4 %
HCT: 45.5 % (ref 39.0–52.0)
Hemoglobin: 15.8 g/dL (ref 13.0–17.0)
Immature Granulocytes: 0 %
Lymphocytes Relative: 26 %
Lymphs Abs: 1.8 10*3/uL (ref 0.7–4.0)
MCH: 29.8 pg (ref 26.0–34.0)
MCHC: 34.7 g/dL (ref 30.0–36.0)
MCV: 85.8 fL (ref 80.0–100.0)
Monocytes Absolute: 0.7 10*3/uL (ref 0.1–1.0)
Monocytes Relative: 10 %
Neutro Abs: 4.1 10*3/uL (ref 1.7–7.7)
Neutrophils Relative %: 59 %
Platelets: 326 10*3/uL (ref 150–400)
RBC: 5.3 MIL/uL (ref 4.22–5.81)
RDW: 12.3 % (ref 11.5–15.5)
WBC: 6.9 10*3/uL (ref 4.0–10.5)
nRBC: 0 % (ref 0.0–0.2)

## 2020-10-17 LAB — COMPREHENSIVE METABOLIC PANEL
ALT: 30 U/L (ref 0–44)
AST: 20 U/L (ref 15–41)
Albumin: 4.1 g/dL (ref 3.5–5.0)
Alkaline Phosphatase: 58 U/L (ref 38–126)
Anion gap: 9 (ref 5–15)
BUN: 17 mg/dL (ref 6–20)
CO2: 23 mmol/L (ref 22–32)
Calcium: 9.8 mg/dL (ref 8.9–10.3)
Chloride: 103 mmol/L (ref 98–111)
Creatinine, Ser: 0.82 mg/dL (ref 0.61–1.24)
GFR, Estimated: >60 mL/min (ref >60)
Glucose, Bld: 96 mg/dL (ref 70–99)
Potassium: 3.8 mmol/L (ref 3.5–5.1)
Sodium: 135 mmol/L (ref 135–145)
Total Bilirubin: 0.4 mg/dL (ref 0.3–1.2)
Total Protein: 7.5 g/dL (ref 6.5–8.1)

## 2020-10-17 LAB — MAGNESIUM: Magnesium: 1.6 mg/dL — ABNORMAL LOW (ref 1.7–2.4)

## 2020-10-17 MED ORDER — METOCLOPRAMIDE HCL 5 MG/ML IJ SOLN
10.0000 mg | Freq: Once | INTRAMUSCULAR | Status: AC
  Administered 2020-10-17: 10 mg via INTRAVENOUS
  Filled 2020-10-17: qty 2

## 2020-10-17 MED ORDER — SODIUM CHLORIDE 0.9 % IV BOLUS
1000.0000 mL | Freq: Once | INTRAVENOUS | Status: AC
  Administered 2020-10-17: 1000 mL via INTRAVENOUS

## 2020-10-17 MED ORDER — DEXAMETHASONE SODIUM PHOSPHATE 10 MG/ML IJ SOLN
10.0000 mg | Freq: Once | INTRAMUSCULAR | Status: AC
  Administered 2020-10-17: 10 mg via INTRAVENOUS
  Filled 2020-10-17: qty 1

## 2020-10-17 MED ORDER — KETOROLAC TROMETHAMINE 15 MG/ML IJ SOLN
15.0000 mg | Freq: Once | INTRAMUSCULAR | Status: AC
  Administered 2020-10-17: 15 mg via INTRAVENOUS
  Filled 2020-10-17: qty 1

## 2020-10-17 NOTE — ED Notes (Signed)
Pt states he has had HA x 4 days, BP at home averages 140/100 Has taken tylenol and Excedrin PRN with little relief since Monday   Took migraine med yesterday with no relief Neuro intact

## 2020-10-17 NOTE — ED Notes (Signed)
Called lab to inform of magnesium added to [facility]

## 2020-10-17 NOTE — ED Notes (Signed)
Pt's pain is much improved, rates between 1-2

## 2020-10-17 NOTE — ED Notes (Signed)
ED Provider at bedside. 

## 2020-10-17 NOTE — ED Triage Notes (Signed)
Patient states had cold over the weekend. Headache 10/15/20 am and continues. Covid home test negative. Home BP 129/95. Some high readings throughout the past few days. MD has been changing meds recently. No B/P meds today.

## 2020-10-17 NOTE — Discharge Instructions (Addendum)
Headache Your lab results showed no acute abnormalities.  For future headaches please try the following regimen: Antiinflammatory medications: Take 600 mg of ibuprofen every 6 hours or 440 mg (over the counter dose) to 500 mg (prescription dose) of naproxen every 12 hours.  Use this regimen until headache subsides for up to 3 days .  After this time, these medications may be used as needed for pain. Take these medications with food to avoid upset stomach. Choose only one of these medications, do not take them together. Acetaminophen: Should you continue to have additional pain while taking the ibuprofen or naproxen, you may add in acetaminophen (generic for Tylenol) as needed. Your daily total maximum amount of acetaminophen from all sources should be limited to 4000mg /day for persons without liver problems, or 2000mg /day for those with liver problems.  Hydration: Have a goal of about a half liter of water every couple hours to stay well hydrated.   Sleep: Please be sure to get plenty of sleep with a goal of 8 hours per night. Having a regular bed time and bedtime routine can help with this.  Screens: Reduce the amount of time you are in front of screens.  Take about a 5-10-minute break every hour or every couple hours to give your eyes rest.  Do not use screens in dark rooms.  Glasses with a blue light filter may also help reduce eye fatigue.  Stress: Take steps to reduce stress as much as possible.   Follow up: Follow-up with your primary care provider on this issue.  May also need to follow-up with the neurologist for increased frequency of headaches. Low magnesium: Your magnesium level was just under the normal range.  Please see the attached directions for dietary changes that can help increase this level.  Test Results for COVID-19 pending  You have a test pending for COVID-19.  Results typically return within about 48 hours.  Be sure to check MyChart for updated results.  We recommend  isolating yourself until results are received.  Patients who have symptoms consistent with COVID-19 should self isolated for: At least 3 days (72 hours) have passed since recovery, defined as resolution of fever without the use of fever reducing medications and improvement in respiratory symptoms (e.g., cough, shortness of breath), and At least 7 days have passed since symptoms first appeared.  If you have no symptoms, but your test returns positive, recommend isolating for at least 10 days.

## 2020-10-17 NOTE — ED Provider Notes (Signed)
[facility] EMERGENCY DEPARTMENT Provider Note   CSN: [id number] Arrival date & time: 10/17/20  1119     History No chief complaint on file.   William Barton is a 50 y.o. male.  HPI      William Barton is a 50 y.o. male, with a history of hyperlipidemia, HTN, presenting to the ED primarily complaining of a headache for the last couple days.  He states he will have on and off headaches multiple times a month at baseline, but they typically resolve with Excedrin or with sleep. He endorses a throbbing bilateral frontal headache, moderate in intensity. Accompanying, and proceeded, by rhinorrhea, nasal congestion, cough, fatigue. Negative home COVID test. States he was seen by a wellness practitioner Monday, May 9, and given injections of testosterone, B12, and Trulicity (for prediabetes). About a month ago, patient was switched from amlodipine to losartan 50 mg daily stating the amlodipine had become ineffective.  3 weeks ago, this was changed to losartan 100 mg daily. Since he was reporting to his PCP that his diastolic blood pressures were still above 100, he was additionally started on HCTZ 25 mg daily.  The last doses of his losartan and HCTZ were yesterday.   Denies fever/chills, neck pain/stiffness, chest pain, shortness of breath, abdominal pain, numbness, weakness, vision changes, dizziness, syncope, N/V/D, or any other complaints.   Past Medical History:  Diagnosis Date  . Allergy to alpha-gal   . 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 Bell's palsy   . Chicken pox   . Coronary artery calcification seen on CAT scan 12/24/2017  . Generalized headaches   . GERD (gastroesophageal reflux disease)   . Hyperlipidemia   . Hypertension   . Hypertriglyceridemia   . OSA (obstructive sleep apnea)    Intolerant to CPAP therapy and now using an oral device    Patient Active Problem List   Diagnosis Date Noted  . Breast mass in male 01/20/2019  . 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 Urticaria 05/24/2018  . 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 Allergic reaction 03/29/2018  . Annual physical exam 03/29/2018  . Muscle spasm 03/29/2018  . Knee pain 03/16/2018  . Coronary artery calcification seen on CAT scan 12/24/2017  . 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 OSA (obstructive sleep apnea)   . Bug bite 11/10/2017  . Cough 11/10/2017  . Fever 11/10/2017  . Sore throat 11/10/2017  . 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 GERD (gastroesophageal reflux disease) 06/22/2017  . Family history of early CAD 08/03/2016  . Benign essential HTN 08/03/2016  . Hyperlipidemia 08/03/2016  . Deviated septum 01/10/2016  . Nasal turbinate hypertrophy 01/10/2016  . Seasonal allergic rhinitis 10/13/2013  . Seasonal allergies 10/13/2013    Past Surgical History:  Procedure Laterality Date  . APPENDECTOMY  1981  . EYE SURGERY     1975  . KNEE ARTHROSCOPY Left    2016  . NASAL SEPTUM SURGERY  05/07/2016  . TONSILLECTOMY  1975       Family History  Problem Relation Age of Onset  . Heart disease Mother   . Breast cancer Maternal Grandmother   . Hypertension Maternal Grandmother   . Prostate cancer Paternal Grandfather     Social History   Tobacco Use  . Smoking status: Never Smoker  . Smokeless tobacco: Never Used  Vaping Use  . Vaping Use: Never used  Substance Use Topics  . Alcohol use: Yes  . Drug use: No  Home Medications Prior to Admission medications   Medication Sig Start Date End Date Taking? Authorizing Provider  hydrochlorothiazide (HYDRODIURIL) 25 MG tablet Take 25 mg by mouth daily.   Yes [provider]  pantoprazole (PROTONIX) 20 MG tablet Take 20 mg by mouth daily.   Yes [provider]  atorvastatin (LIPITOR) 40 MG tablet Take 1 tablet (40 mg total) by mouth daily. 11/27/19   Quintella Reicherturner, Traci R, MD  EPINEPHrine (AUVI-Q) 0.3 mg/0.3 mL IJ SOAJ injection Inject 0.3 mLs (0.3 mg total) into the  muscle as needed for anaphylaxis. 07/26/18   Bobbitt, Heywood Ilesalph Carter, MD  famotidine (PEPCID) 40 MG tablet Take 1 tablet by mouth daily. 03/29/18   [provider]  fenofibrate micronized (LOFIBRA) 134 MG capsule Take 134 mg by mouth daily before breakfast.    [provider]  losartan (COZAAR) 50 MG tablet Take 2 tablets by mouth daily. 09/16/20   [provider]  cetirizine (ZYRTEC) 10 MG tablet Take by mouth.  05/24/18  [provider]    Allergies    Ace inhibitors  Review of Systems   Review of Systems  Constitutional: Negative for chills, diaphoresis and fever.  HENT: Positive for congestion, rhinorrhea and sinus pain. Negative for facial swelling, sore throat and trouble swallowing.   Eyes: Negative for visual disturbance.  Respiratory: Positive for cough. Negative for shortness of breath.   Gastrointestinal: Negative for abdominal pain, diarrhea, nausea and vomiting.  Musculoskeletal: Negative for back pain, neck pain and neck stiffness.  Neurological: Positive for headaches. Negative for dizziness, syncope, speech difficulty, weakness and numbness.  All other systems reviewed and are negative.   Physical Exam Updated Vital Signs BP (!) 128/113 (BP Location: Right Arm)   Pulse (!) 107   Temp 98.4 F (36.9 C) (Oral)   Resp 18   SpO2 97%   Physical Exam Vitals and nursing note reviewed.  Constitutional:      General: He is not in acute distress.    Appearance: He is well-developed. He is not diaphoretic.  HENT:     Head: Normocephalic and atraumatic.     Nose: Mucosal edema and congestion present.     Mouth/Throat:     Mouth: Mucous membranes are moist.     Pharynx: Oropharynx is clear.  Eyes:     Conjunctiva/sclera: Conjunctivae normal.  Neck:     Comments: No changes in patient's symptoms with range of motion of the neck. Cardiovascular:     Rate and Rhythm: Normal rate and regular rhythm.     Pulses: Normal pulses.           Radial pulses are 2+ on the right side and 2+ on the left side.       Posterior tibial pulses are 2+ on the right side and 2+ on the left side.     Heart sounds: Normal heart sounds.     Comments: Tactile temperature in the extremities appropriate and equal bilaterally. Pulmonary:     Effort: Pulmonary effort is normal. No respiratory distress.     Breath sounds: Normal breath sounds.  Abdominal:     Palpations: Abdomen is soft.     Tenderness: There is no abdominal tenderness. There is no guarding.  Musculoskeletal:     Cervical back: Normal range of motion and neck supple.     Right lower leg: No edema.     Left lower leg: No edema.  Lymphadenopathy:     Cervical: No cervical adenopathy.  Skin:  General: Skin is warm and dry.  Neurological:     Mental Status: He is alert and oriented to person, place, and time.     Comments: No noted acute cognitive deficit. Sensation grossly intact to light touch in the extremities.   Grip strengths equal bilaterally.   Strength 5/5 in all extremities.  No gait disturbance.  Coordination intact.  Cranial nerves III-XII grossly intact.  Handles oral secretions without noted difficulty.  No noted phonation or speech deficit. No facial droop.   Psychiatric:        Mood and Affect: Mood and affect normal.        Speech: Speech normal.        Behavior: Behavior normal.     ED Results / Procedures / Treatments   Labs (all labs ordered are listed, but only abnormal results are displayed) Labs Reviewed  MAGNESIUM - Abnormal; Notable for the following components:      Result Value   Magnesium 1.6 (*)    All other components within normal limits  SARS CORONAVIRUS 2 (TAT 6-24 HRS)  COMPREHENSIVE METABOLIC PANEL  CBC WITH DIFFERENTIAL/PLATELET    EKG None  Radiology No results found.  Procedures Procedures   Medications Ordered in ED Medications  metoCLOPramide (REGLAN) injection 10 mg (10 mg Intravenous Given 10/17/20 1230)   ketorolac (TORADOL) 15 MG/ML injection 15 mg (15 mg Intravenous Given 10/17/20 1228)  dexamethasone (DECADRON) injection 10 mg (10 mg Intravenous Given 10/17/20 1229)  sodium chloride 0.9 % bolus 1,000 mL (0 mLs Intravenous Stopped 10/17/20 1338)    ED Course  I have reviewed the triage vital signs and the nursing notes.  Pertinent labs & imaging results that were available during my care of the patient were reviewed by me and considered in my medical decision making (see chart for details).    MDM Rules/Calculators/A&P                          Patient complains of headache as well as nasal congestion and cough. Patient is nontoxic appearing, afebrile, not tachycardic, not tachypneic, not hypotensive, maintains excellent SPO2 on room air, and is in no apparent distress.   I have reviewed the patient's chart to obtain more information.   I reviewed and interpreted the patient's labs. Patient had complete resolution in symptoms following treatment here in the ED. Magnesium slightly low.  Patient was counseled on this.  He will follow-up with his PCP on this matter.  Other lab results unremarkable.  COVID test pending at time of discharge. Alternative diagnoses, such as vascular abnormality, among others, as a cause for the patient's symptoms were considered, but thought less likely due to the duration of the patient's symptoms, lack of neurologic deficits, lack of pulse deficits, the overall well-appearing presentation of the patient, resolution here in the ED. The patient was given instructions for home care as well as return precautions. Patient voices understanding of these instructions, accepts the plan, and is comfortable with discharge.  Vitals:   10/17/20 1200 10/17/20 1230 10/17/20 1300 10/17/20 1356  BP: (!) 124/91  134/85 (!) 140/91  Pulse: 94 83 83 94  Resp: 18  18 18   Temp:    98 F (36.7 C)  TempSrc:    Oral  SpO2: 98% 97% 96% 98%  Weight:      Height:          Final  Clinical Impression(s) / ED Diagnoses Final diagnoses:  Frontal  headache    Rx / DC Orders ED Discharge Orders    None       Concepcion Living 10/17/20 1412    Gwyneth Sprout, MD 10/30/20 0830

## 2020-10-18 LAB — SARS CORONAVIRUS 2 (TAT 6-24 HRS): SARS Coronavirus 2: NEGATIVE

## 2020-12-25 ENCOUNTER — Encounter (HOSPITAL_COMMUNITY): Payer: Self-pay | Admitting: Cardiology

## 2020-12-25 ENCOUNTER — Other Ambulatory Visit (HOSPITAL_COMMUNITY): Payer: 59

## 2020-12-25 NOTE — Progress Notes (Unsigned)
Patient ID: William Barton, male   DOB: [birth year], 50 y.o.   MRN: [id number]  Verified appointment "no show" status with Jeanie Cooks at 8:17am.

## 2021-01-02 ENCOUNTER — Other Ambulatory Visit: Payer: Self-pay | Admitting: Cardiology

## 2021-01-30 ENCOUNTER — Other Ambulatory Visit: Payer: Self-pay | Admitting: Cardiology

## 2021-02-13 ENCOUNTER — Other Ambulatory Visit: Payer: Self-pay | Admitting: Cardiology

## 2021-05-15 ENCOUNTER — Other Ambulatory Visit: Payer: Self-pay | Admitting: Cardiology

## 2021-07-03 ENCOUNTER — Other Ambulatory Visit: Payer: Self-pay | Admitting: Cardiology

## 2021-07-14 ENCOUNTER — Other Ambulatory Visit: Payer: Self-pay | Admitting: Cardiology

## 2021-07-15 ENCOUNTER — Other Ambulatory Visit: Payer: Self-pay | Admitting: Cardiology

## 2021-12-21 ENCOUNTER — Other Ambulatory Visit: Payer: Self-pay

## 2021-12-21 ENCOUNTER — Encounter (HOSPITAL_BASED_OUTPATIENT_CLINIC_OR_DEPARTMENT_OTHER): Payer: Self-pay | Admitting: Emergency Medicine

## 2021-12-21 ENCOUNTER — Emergency Department (HOSPITAL_BASED_OUTPATIENT_CLINIC_OR_DEPARTMENT_OTHER)
Admission: EM | Admit: 2021-12-21 | Discharge: 2021-12-21 | Disposition: A | Discharge status: Home / Self Care | Payer: 59 | Attending: Emergency Medicine | Admitting: Emergency Medicine

## 2021-12-21 DIAGNOSIS — W269XXA Contact with unspecified sharp object(s), initial encounter: Secondary | ICD-10-CM | POA: Diagnosis not present

## 2021-12-21 DIAGNOSIS — S65503A Unspecified injury of blood vessel of left middle finger, initial encounter: Secondary | ICD-10-CM | POA: Diagnosis present

## 2021-12-21 DIAGNOSIS — S61213A Laceration without foreign body of left middle finger without damage to nail, initial encounter: Secondary | ICD-10-CM | POA: Insufficient documentation

## 2021-12-21 DIAGNOSIS — S61209A Unspecified open wound of unspecified finger without damage to nail, initial encounter: Secondary | ICD-10-CM

## 2021-12-21 MED ORDER — TETANUS-DIPHTH-ACELL PERTUSSIS 5-2.5-18.5 LF-MCG/0.5 IM SUSY: 0.5000 mL | PREFILLED_SYRINGE | Freq: Once | INTRAMUSCULAR | Status: DC

## 2021-12-21 NOTE — ED Triage Notes (Signed)
Pt was peeling a lemon and sliced part of his left middle finger. Unknown last tetanus shot.

## 2021-12-21 NOTE — ED Notes (Signed)
Wound wrapped with combat guaze and pressure dressing, pa notified

## 2021-12-21 NOTE — ED Notes (Signed)
Laceration unwrapped and rechecked. Bleeding more controlled at this time, cont to have oozing blood from wound, PA notified and at the bedside.

## 2021-12-21 NOTE — ED Provider Notes (Signed)
[facility] EMERGENCY DEPT Provider Note   CSN: [id number] Arrival date & time: 12/21/21  1945     History  Chief Complaint  Patient presents with   Laceration    William Barton is a 51 y.o. male.   Laceration  Patient is a 51 year old male with no pertinent past medical history presented emergency room today with injury to his left middle finger.  He was peeling a lemon and sliced off the tip of his finger.  He states he was unable to control bleeding which is what brought him to the emergency room.  He is uncertain of last tetanus vaccine       Home Medications Prior to Admission medications   Medication Sig Start Date End Date Taking? Authorizing Provider  atorvastatin (LIPITOR) 40 MG tablet Take 1 tablet (40 mg total) by mouth daily. Please make overdue appt with Dr. Mayford Knife before anymore refills. Thank you 3rd and Final Attempt 07/15/21   Quintella Reichert, MD  EPINEPHrine (AUVI-Q) 0.3 mg/0.3 mL IJ SOAJ injection Inject 0.3 mLs (0.3 mg total) into the muscle as needed for anaphylaxis. 07/26/18   Bobbitt, Heywood Iles, MD  famotidine (PEPCID) 40 MG tablet Take 1 tablet by mouth daily. 03/29/18   [provider]  fenofibrate micronized (LOFIBRA) 134 MG capsule Take 134 mg by mouth daily before breakfast.    [provider]  hydrochlorothiazide (HYDRODIURIL) 25 MG tablet Take 25 mg by mouth daily.    [provider]  losartan (COZAAR) 50 MG tablet Take 2 tablets by mouth daily. 09/16/20   [provider]  pantoprazole (PROTONIX) 20 MG tablet Take 20 mg by mouth daily.    [provider]  cetirizine (ZYRTEC) 10 MG tablet Take by mouth.  05/24/18  [provider]      Allergies    Ace inhibitors    Review of Systems   Review of Systems  Physical Exam Updated Vital Signs BP 122/85 (BP Location: Right Arm)   Pulse 97   Temp 98 F (36.7 C)   Resp 19   Ht 5\' 8"  (1.727 m)   Wt 86.2 kg   SpO2 96%   BMI  28.89 kg/m  Physical Exam Vitals and nursing note reviewed.  Constitutional:      General: He is not in acute distress.    Appearance: Normal appearance. He is not ill-appearing.  HENT:     Head: Normocephalic and atraumatic.  Eyes:     General: No scleral icterus.       Right eye: No discharge.        Left eye: No discharge.     Conjunctiva/sclera: Conjunctivae normal.  Pulmonary:     Effort: Pulmonary effort is normal.     Breath sounds: No stridor.  Musculoskeletal:     Comments: ROM finger intact  Skin:    Comments: Left middle finger with skin avulsion of the tip of the finger with a small part of the nail removed as well.  Actively bleeding slowly.  Nonpulsatile bleeding.  Sensation intact in fingertip  Neurological:     Mental Status: He is alert and oriented to person, place, and time. Mental status is at baseline.     ED Results / Procedures / Treatments   Labs (all labs ordered are listed, but only abnormal results are displayed) Labs Reviewed - No data to display  EKG None  Radiology No results found.  Procedures Procedures    Medications Ordered in ED Medications  Tdap (BOOSTRIX) injection 0.5 mL (has no administration in time range)    ED Course/ Medical Decision Making/ A&P                           Medical Decision Making  Patient has avulsed the tip of his left middle finger.  There is no suturable laceration.  He is distally neurovascularly intact.  Combat gauze was used to provide hemostasis.  Bleeding slowed to nearly as stopped.  No bleeding when bandages in place he is not bleeding through the dressing.  Gave wound care instructions for home.  Return precautions given.  Will discharge home at this time.  Final Clinical Impression(s) / ED Diagnoses Final diagnoses:  Avulsion of skin of finger, initial encounter    Rx / DC Orders ED Discharge Orders     None         Gailen Shelter, [place] 12/21/21 2258    Blane Ohara,  MD 12/22/21 [phone]
